# Patient Record
Sex: Male | Born: 1955
Health system: Southern US, Community
[De-identification: ages and names within clinical notes are randomized; demographics above are authoritative.]

## PROBLEM LIST (undated history)

## (undated) DIAGNOSIS — G43109 Migraine with aura, not intractable, without status migrainosus: Secondary | ICD-10-CM

## (undated) HISTORY — DX: Migraine with aura, not intractable, without status migrainosus: G43.109

---

## 2002-04-19 ENCOUNTER — Ambulatory Visit (HOSPITAL_COMMUNITY): Admission: RE | Admit: 2002-04-19 | Discharge: 2002-04-19 | Payer: Self-pay | Admitting: Gastroenterology

## 2014-02-23 ENCOUNTER — Ambulatory Visit (INDEPENDENT_AMBULATORY_CARE_PROVIDER_SITE_OTHER): Payer: BC Managed Care – PPO | Admitting: Internal Medicine

## 2014-02-23 DIAGNOSIS — Z23 Encounter for immunization: Secondary | ICD-10-CM

## 2014-02-23 DIAGNOSIS — A09 Infectious gastroenteritis and colitis, unspecified: Secondary | ICD-10-CM

## 2014-02-23 DIAGNOSIS — Z789 Other specified health status: Secondary | ICD-10-CM

## 2014-02-23 MED ORDER — ATOVAQUONE-PROGUANIL HCL 250-100 MG PO TABS: 1.0000 | ORAL_TABLET | Freq: Every day | ORAL | Status: DC

## 2014-02-23 MED ORDER — AZITHROMYCIN 500 MG PO TABS: 500.0000 mg | ORAL_TABLET | Freq: Every day | ORAL | Status: DC

## 2014-02-23 NOTE — Progress Notes (Signed)
  Cc = travel counseling and vaccines to go to 11 day trip to [place] Subjective:    Patient ID: Brent White, male    DOB: 1956-10-17, 58 y.o.   MRN: [id number]  HPI Brent White is a 58yo M in good state of health who is here with his wife, they will be going to [place], [place] for a period of 11 days leaving may 7 thru the 18th. He previously traveled to [place] where he had typhoid oral vaccine still valid thru may. He does not have allergies to meds. Only takes asa and allergy medications.  Previous travel = [place], [place], [place], [place] imms = hep a, hep b, tetanus, flu in the last year   Review of Systems     Objective:   Physical Exam        Assessment & Plan:  Pre travel vaccine = will give yellow fever, Pre travel counseling = gave recs to prevent travelers diarrhea. And gave rx for azithromycin.   Malaria proph = gave rx for malarone and instructions to take it in addition to mosquito repellant

## 2016-01-18 ENCOUNTER — Ambulatory Visit (INDEPENDENT_AMBULATORY_CARE_PROVIDER_SITE_OTHER): Payer: BLUE CROSS/BLUE SHIELD | Admitting: Family Medicine

## 2016-01-18 VITALS — BP 110/62 | HR 94 | Temp 98.7°F | Resp 16 | Ht 71.0 in | Wt 245.0 lb

## 2016-01-18 DIAGNOSIS — Z23 Encounter for immunization: Secondary | ICD-10-CM | POA: Diagnosis not present

## 2016-01-18 DIAGNOSIS — S61412A Laceration without foreign body of left hand, initial encounter: Secondary | ICD-10-CM

## 2016-01-18 NOTE — Patient Instructions (Addendum)
You have received a TDaP vaccination today  Wound care as directed by the PA Sutured Wound Care Sutures are stitches that can be used to close wounds. Taking care of your wound properly can help to prevent pain and infection. It can also help your wound to heal more quickly. HOW TO CARE FOR YOUR SUTURED WOUND Wound Care  Keep the wound clean and dry.  If you were given a bandage (dressing), you should change it at least once per day or as directed by your health care provider. You should also change it if it becomes wet or dirty.  Keep the wound completely dry for the first 24 hours or as directed by your health care provider. After that time, you may shower or bathe. However, make sure that the wound is not soaked in water until the sutures have been removed.  Clean the wound one time each day or as directed by your health care provider.  Wash the wound with soap and water.  Rinse the wound with water to remove all soap.  Pat the wound dry with a clean towel. Do not rub the wound.  Aftercleaning the wound, apply a thin layer of antibioticointment as directed by your health care provider. This will help to prevent infection and keep the dressing from sticking to the wound.  Have the sutures removed as directed by your health care provider. General Instructions  Take or apply medicines only as directed by your health care provider.  To help prevent scarring, make sure to cover your wound with sunscreen whenever you are outside after the sutures are removed and the wound is healed. Make sure to wear a sunscreen of at least 30 SPF.  If you were prescribed an antibiotic medicine or ointment, finish all of it even if you start to feel better.  Do not scratch or pick at the wound.  Keep all follow-up visits as directed by your health care provider. This is important.  Check your wound every day for signs of infection. Watch for:   Redness, swelling, or pain.  Fluid, blood, or  pus.  Raise (elevate) the injured area above the level of your heart while you are sitting or lying down, if possible.  Avoid stretching your wound.  Drink enough fluids to keep your urine clear or pale yellow. SEEK MEDICAL CARE IF:  You received a tetanus shot and you have swelling, severe pain, redness, or bleeding at the injection site.  You have a fever.  A wound that was closed breaks open.  You notice a bad smell coming from the wound.  You notice something coming out of the wound, such as wood or glass.  Your pain is not controlled with medicine.  You have increased redness, swelling, or pain at the site of your wound.  You have fluid, blood, or pus coming from your wound.  You notice a change in the color of your skin near your wound.  You need to change the dressing frequently due to fluid, blood, or pus draining from the wound.  You develop a new rash.  You develop numbness around the wound. SEEK IMMEDIATE MEDICAL CARE IF:  You develop severe swelling around the injury site.  Your pain suddenly increases and is severe.  You develop painful lumps near the wound or on skin that is anywhere on your body.  You have a red streak going away from your wound.  The wound is on your hand or foot and you cannot properly  move a finger or toe.  The wound is on your hand or foot and you notice that your fingers or toes look pale or bluish.   This information is not intended to replace advice given to you by your health care provider. Make sure you discuss any questions you have with your health care provider.   Document Released: 12/24/2004 Document Revised: 04/02/2015 Document Reviewed: 06/28/2013 Elsevier Interactive Patient Education [employer].

## 2016-01-18 NOTE — Progress Notes (Signed)
Procedure: verbal consent obtained.  1% lidocaine placed at the wound site.  Cleansed with soap and water.  5-0 ethilon utilized to place 2 simple and 1 horizontal mattress suture at the 2cm laceration interdigit area of the 2nd and 3rd finger.  Cleansed with normal saline.  Dressings applied.

## 2016-01-18 NOTE — Progress Notes (Signed)
Patient ID: Brent White, male    DOB: [birth year]  Age: 60 y.o. MRN: [id number]  Chief Complaint  Patient presents with  . 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 He was fixing a door lock and slipped with the chisel, injuring his left hand between the webspace of the second and third finger. His last tetanus shot was 5 or 6 years ago. He is otherwise healthy except admits that he is overweight.  Current allergies, medications, problem list, past/family and social histories reviewed.  Objective:  BP 110/62 mmHg  Pulse 94  Temp(Src) 98.7 F (37.1 C) (Oral)  Resp 16  Ht 5\' 11"  (1.803 m)  Wt 245 lb (111.131 kg)  BMI 34.19 kg/m2  SpO2 96%  No major acute distress. 0.5 cm laceration in the left hand second third finger webspace palmar side. It is fairly deep cavity. Neurovascular intact.  Assessment & Plan:   Assessment: 1. Laceration of hand, left, initial encounter       Plan: Suture repair will be needed. He will receive a Tdap vaccine.  Orders Placed This Encounter  Procedures  . Tdap vaccine greater than or equal to 7yo IM    Meds ordered this encounter  Medications  . aspirin 81 MG tablet    Sig: Take 81 mg by mouth daily.         Patient Instructions  You have received a TDaP vaccination today  Wound care as directed by the PA Sutured Wound Care Sutures are stitches that can be used to close wounds. Taking care of your wound properly can help to prevent pain and infection. It can also help your wound to heal more quickly. HOW TO CARE FOR YOUR SUTURED WOUND Wound Care  Keep the wound clean and dry.  If you were given a bandage (dressing), you should change it at least once per day or as directed by your health care provider. You should also change it if it becomes wet or dirty.  Keep the wound completely dry for the first 24 hours or as directed by  your health care provider. After that time, you may shower or bathe. However, make sure that the wound is not soaked in water until the sutures have been removed.  Clean the wound one time each day or as directed by your health care provider.  Wash the wound with soap and water.  Rinse the wound with water to remove all soap.  Pat the wound dry with a clean towel. Do not rub the wound.  Aftercleaning the wound, apply a thin layer of antibioticointment as directed by your health care provider. This will help to prevent infection and keep the dressing from sticking to the wound.  Have the sutures removed as directed by your health care provider. General Instructions  Take or apply medicines only as directed by your health care provider.  To help prevent scarring, make sure to cover your wound with sunscreen whenever you are outside after the sutures are removed and the wound is healed. Make sure to wear a sunscreen of at least 30 SPF.  If you were prescribed an antibiotic medicine or ointment, finish all of it even if you start to feel better.  Do not scratch or pick at the wound.  Keep all follow-up visits as directed by your health care provider. This is important.  Check your  wound every day for signs of infection. Watch for:   Redness, swelling, or pain.  Fluid, blood, or pus.  Raise (elevate) the injured area above the level of your heart while you are sitting or lying down, if possible.  Avoid stretching your wound.  Drink enough fluids to keep your urine clear or pale yellow. SEEK MEDICAL CARE IF:  You received a tetanus shot and you have swelling, severe pain, redness, or bleeding at the injection site.  You have a fever.  A wound that was closed breaks open.  You notice a bad smell coming from the wound.  You notice something coming out of the wound, such as wood or glass.  Your pain is not controlled with medicine.  You have increased redness, swelling, or  pain at the site of your wound.  You have fluid, blood, or pus coming from your wound.  You notice a change in the color of your skin near your wound.  You need to change the dressing frequently due to fluid, blood, or pus draining from the wound.  You develop a new rash.  You develop numbness around the wound. SEEK IMMEDIATE MEDICAL CARE IF:  You develop severe swelling around the injury site.  Your pain suddenly increases and is severe.  You develop painful lumps near the wound or on skin that is anywhere on your body.  You have a red streak going away from your wound.  The wound is on your hand or foot and you cannot properly move a finger or toe.  The wound is on your hand or foot and you notice that your fingers or toes look pale or bluish.   This information is not intended to replace advice given to you by your health care provider. Make sure you discuss any questions you have with your health care provider.   Document Released: 12/24/2004 Document Revised: 04/02/2015 Document Reviewed: 06/28/2013 Elsevier Interactive Patient Education [employer].      Return in about 10 days (around 01/28/2016).   HOPPER,DAVID, MD 01/18/2016

## 2016-01-28 ENCOUNTER — Other Ambulatory Visit: Payer: Self-pay | Admitting: Cardiology

## 2016-01-28 DIAGNOSIS — R0789 Other chest pain: Secondary | ICD-10-CM

## 2016-01-29 ENCOUNTER — Ambulatory Visit
Admission: RE | Admit: 2016-01-29 | Discharge: 2016-01-29 | Disposition: A | Discharge status: Home / Self Care | Payer: No Typology Code available for payment source | Source: Ambulatory Visit | Attending: Cardiology | Admitting: Cardiology

## 2016-01-29 DIAGNOSIS — R0789 Other chest pain: Secondary | ICD-10-CM

## 2016-02-13 ENCOUNTER — Encounter: Payer: Self-pay | Admitting: Podiatry

## 2016-02-13 ENCOUNTER — Ambulatory Visit (INDEPENDENT_AMBULATORY_CARE_PROVIDER_SITE_OTHER): Payer: BLUE CROSS/BLUE SHIELD | Admitting: Podiatry

## 2016-02-13 ENCOUNTER — Ambulatory Visit (INDEPENDENT_AMBULATORY_CARE_PROVIDER_SITE_OTHER): Payer: BLUE CROSS/BLUE SHIELD

## 2016-02-13 VITALS — BP 107/63 | HR 65 | Resp 16 | Ht 71.0 in | Wt 240.0 lb

## 2016-02-13 DIAGNOSIS — M779 Enthesopathy, unspecified: Secondary | ICD-10-CM

## 2016-02-13 MED ORDER — MELOXICAM 15 MG PO TABS: 15.0000 mg | ORAL_TABLET | Freq: Every day | ORAL | Status: DC

## 2016-02-13 NOTE — Progress Notes (Signed)
   Subjective:    Patient ID: Brent White, male    DOB: 06-16-1956, 60 y.o.   MRN: [id number]  HPI Patient presents with foot pain in their left foot; dorsal-below 1st toe & medial side. Pt stated, "pain is on and off and got foot stuck underneath a chair"; x1 month   Review of Systems  All other systems reviewed and are negative.      Objective:   Physical Exam        Assessment & Plan:

## 2016-02-14 NOTE — Progress Notes (Signed)
Subjective:     Patient ID: Brent White, male   DOB: [birth year], 60 y.o.   MRN: [id number]  HPI patient states he has pain on the top and side of his left foot secondary to an injury that was sustained 1 month ago with discomfort with palpation and inability to walk without some degree of compensation. He states the pain is not severe but he was concerned about fracture   Review of Systems  All other systems reviewed and are negative.      Objective:   Physical Exam  Constitutional: He is oriented to person, place, and time.  Cardiovascular: Intact distal pulses.   Musculoskeletal: Normal range of motion.  Neurological: He is oriented to person, place, and time.  Skin: Skin is warm.  Nursing note and vitals reviewed.  neurovascular status found to be intact muscle strength adequate range of motion within normal limits. Patient's noted to have discomfort on the dorsal medial aspect of left foot with mild edema but it's localized in nature with no significant pathology. Patient is noted to have no muscle strength loss of the extensors or flexors also besides the midfoot and range of motion first MPJ was adequate     Assessment:     Probable low grade trauma to the left foot with inflammation    Plan:     H&P and x-rays reviewed of condition. At this point I have recommended oral anti-inflammatories which were prescribed today mobile big 15 mg daily along with ice therapy and reduced activity. If symptoms continue patient will let us know

## 2016-10-12 DIAGNOSIS — R42 Dizziness and giddiness: Secondary | ICD-10-CM | POA: Diagnosis not present

## 2016-10-12 DIAGNOSIS — R001 Bradycardia, unspecified: Secondary | ICD-10-CM | POA: Diagnosis not present

## 2017-01-22 DIAGNOSIS — E7800 Pure hypercholesterolemia, unspecified: Secondary | ICD-10-CM | POA: Diagnosis not present

## 2017-01-22 DIAGNOSIS — Z125 Encounter for screening for malignant neoplasm of prostate: Secondary | ICD-10-CM | POA: Diagnosis not present

## 2017-01-22 DIAGNOSIS — Z0000 Encounter for general adult medical examination without abnormal findings: Secondary | ICD-10-CM | POA: Diagnosis not present

## 2017-04-13 DIAGNOSIS — D2371 Other benign neoplasm of skin of right lower limb, including hip: Secondary | ICD-10-CM | POA: Diagnosis not present

## 2017-04-13 DIAGNOSIS — D2262 Melanocytic nevi of left upper limb, including shoulder: Secondary | ICD-10-CM | POA: Diagnosis not present

## 2017-04-13 DIAGNOSIS — L821 Other seborrheic keratosis: Secondary | ICD-10-CM | POA: Diagnosis not present

## 2017-04-13 DIAGNOSIS — L918 Other hypertrophic disorders of the skin: Secondary | ICD-10-CM | POA: Diagnosis not present

## 2018-05-31 DIAGNOSIS — J209 Acute bronchitis, unspecified: Secondary | ICD-10-CM | POA: Diagnosis not present

## 2018-08-23 DIAGNOSIS — Z125 Encounter for screening for malignant neoplasm of prostate: Secondary | ICD-10-CM | POA: Diagnosis not present

## 2018-08-23 DIAGNOSIS — Z0000 Encounter for general adult medical examination without abnormal findings: Secondary | ICD-10-CM | POA: Diagnosis not present

## 2018-08-23 DIAGNOSIS — R252 Cramp and spasm: Secondary | ICD-10-CM | POA: Diagnosis not present

## 2018-08-23 DIAGNOSIS — R0683 Snoring: Secondary | ICD-10-CM | POA: Diagnosis not present

## 2018-08-23 DIAGNOSIS — E7800 Pure hypercholesterolemia, unspecified: Secondary | ICD-10-CM | POA: Diagnosis not present

## 2018-11-21 DIAGNOSIS — D3131 Benign neoplasm of right choroid: Secondary | ICD-10-CM | POA: Diagnosis not present

## 2018-12-19 DIAGNOSIS — G4719 Other hypersomnia: Secondary | ICD-10-CM | POA: Diagnosis not present

## 2019-01-12 DIAGNOSIS — G4733 Obstructive sleep apnea (adult) (pediatric): Secondary | ICD-10-CM | POA: Diagnosis not present

## 2019-06-20 DIAGNOSIS — L918 Other hypertrophic disorders of the skin: Secondary | ICD-10-CM | POA: Diagnosis not present

## 2019-06-20 DIAGNOSIS — L821 Other seborrheic keratosis: Secondary | ICD-10-CM | POA: Diagnosis not present

## 2019-09-21 DIAGNOSIS — Z0000 Encounter for general adult medical examination without abnormal findings: Secondary | ICD-10-CM | POA: Diagnosis not present

## 2019-10-02 ENCOUNTER — Other Ambulatory Visit: Payer: Self-pay

## 2019-10-02 DIAGNOSIS — Z20822 Contact with and (suspected) exposure to covid-19: Secondary | ICD-10-CM

## 2019-10-03 LAB — NOVEL CORONAVIRUS, NAA: SARS-CoV-2, NAA: NOT DETECTED

## 2019-10-16 ENCOUNTER — Other Ambulatory Visit: Payer: Self-pay

## 2019-10-16 DIAGNOSIS — Z20822 Contact with and (suspected) exposure to covid-19: Secondary | ICD-10-CM

## 2019-10-18 LAB — NOVEL CORONAVIRUS, NAA: SARS-CoV-2, NAA: NOT DETECTED

## 2019-10-20 DIAGNOSIS — Z23 Encounter for immunization: Secondary | ICD-10-CM | POA: Diagnosis not present

## 2019-10-20 DIAGNOSIS — E7800 Pure hypercholesterolemia, unspecified: Secondary | ICD-10-CM | POA: Diagnosis not present

## 2019-10-20 DIAGNOSIS — Z125 Encounter for screening for malignant neoplasm of prostate: Secondary | ICD-10-CM | POA: Diagnosis not present

## 2020-02-02 ENCOUNTER — Ambulatory Visit: Payer: No Typology Code available for payment source | Attending: Internal Medicine

## 2020-02-02 DIAGNOSIS — Z23 Encounter for immunization: Secondary | ICD-10-CM

## 2020-02-02 NOTE — Progress Notes (Signed)
   Covid-19 Vaccination Clinic  Name:  DILRAJ HERIN    MRN: [id number] DOB: 06-Sep-1956  02/02/2020  Mr. Motter was observed post Covid-19 immunization for 15 minutes without incident. He was provided with Vaccine Information Sheet and instruction to access the V-Safe system.   Mr. Gaw was instructed to call 911 with any severe reactions post vaccine: Marland Kitchen Difficulty breathing  . Swelling of face and throat  . A fast heartbeat  . A bad rash all over body  . Dizziness and weakness   Immunizations Administered    Name Date Dose VIS Date Route   Pfizer COVID-19 Vaccine 02/02/2020  5:00 PM 0.3 mL 11/10/2019 Intramuscular   Manufacturer: [place]   Lot: [id number]   [place]: [id number]

## 2020-03-05 ENCOUNTER — Ambulatory Visit: Payer: No Typology Code available for payment source | Attending: Internal Medicine

## 2020-03-05 DIAGNOSIS — Z23 Encounter for immunization: Secondary | ICD-10-CM

## 2020-03-05 NOTE — Progress Notes (Signed)
   Covid-19 Vaccination Clinic  Name:  Brent White    MRN: [id number] DOB: 01/09/1956  03/05/2020  [name] was observed post Covid-19 immunization for 15 minutes without incident. He was provided with Vaccine Information Sheet and instruction to access the V-Safe system.   [name] was instructed to call 911 with any severe reactions post vaccine: Marland Kitchen Difficulty breathing  . Swelling of face and throat  . A fast heartbeat  . A bad rash all over body  . Dizziness and weakness   Immunizations Administered    Name Date Dose VIS Date Route   Pfizer COVID-19 Vaccine 03/05/2020  4:55 PM 0.3 mL 11/10/2019 Intramuscular   Manufacturer: [employer]   Lot: [id number]   [place]: [id number]

## 2020-03-08 DIAGNOSIS — Z1159 Encounter for screening for other viral diseases: Secondary | ICD-10-CM | POA: Diagnosis not present

## 2020-03-13 DIAGNOSIS — Z800 Family history of malignant neoplasm of digestive organs: Secondary | ICD-10-CM | POA: Diagnosis not present

## 2020-03-13 DIAGNOSIS — Z1211 Encounter for screening for malignant neoplasm of colon: Secondary | ICD-10-CM | POA: Diagnosis not present

## 2020-03-13 DIAGNOSIS — K5140 Inflammatory polyps of colon without complications: Secondary | ICD-10-CM | POA: Diagnosis not present

## 2020-06-04 DIAGNOSIS — D3131 Benign neoplasm of right choroid: Secondary | ICD-10-CM | POA: Diagnosis not present

## 2020-06-11 DIAGNOSIS — I493 Ventricular premature depolarization: Secondary | ICD-10-CM | POA: Diagnosis not present

## 2020-06-23 DIAGNOSIS — S29012A Strain of muscle and tendon of back wall of thorax, initial encounter: Secondary | ICD-10-CM | POA: Diagnosis not present

## 2020-07-09 DIAGNOSIS — M546 Pain in thoracic spine: Secondary | ICD-10-CM | POA: Diagnosis not present

## 2020-07-09 DIAGNOSIS — M6281 Muscle weakness (generalized): Secondary | ICD-10-CM | POA: Diagnosis not present

## 2020-07-11 DIAGNOSIS — M546 Pain in thoracic spine: Secondary | ICD-10-CM | POA: Diagnosis not present

## 2020-07-11 DIAGNOSIS — M6281 Muscle weakness (generalized): Secondary | ICD-10-CM | POA: Diagnosis not present

## 2020-07-15 DIAGNOSIS — M546 Pain in thoracic spine: Secondary | ICD-10-CM | POA: Diagnosis not present

## 2020-07-15 DIAGNOSIS — M6281 Muscle weakness (generalized): Secondary | ICD-10-CM | POA: Diagnosis not present

## 2020-07-18 DIAGNOSIS — M6281 Muscle weakness (generalized): Secondary | ICD-10-CM | POA: Diagnosis not present

## 2020-07-18 DIAGNOSIS — M546 Pain in thoracic spine: Secondary | ICD-10-CM | POA: Diagnosis not present

## 2020-07-22 DIAGNOSIS — M546 Pain in thoracic spine: Secondary | ICD-10-CM | POA: Diagnosis not present

## 2020-07-22 DIAGNOSIS — M6281 Muscle weakness (generalized): Secondary | ICD-10-CM | POA: Diagnosis not present

## 2020-07-25 DIAGNOSIS — M6281 Muscle weakness (generalized): Secondary | ICD-10-CM | POA: Diagnosis not present

## 2020-07-25 DIAGNOSIS — M546 Pain in thoracic spine: Secondary | ICD-10-CM | POA: Diagnosis not present

## 2020-07-29 ENCOUNTER — Encounter: Payer: Self-pay | Admitting: Cardiology

## 2020-07-29 ENCOUNTER — Ambulatory Visit: Payer: BC Managed Care – PPO | Admitting: Cardiology

## 2020-07-29 ENCOUNTER — Other Ambulatory Visit: Payer: Self-pay

## 2020-07-29 VITALS — BP 108/70 | HR 62 | Ht 71.0 in | Wt 237.0 lb

## 2020-07-29 DIAGNOSIS — I493 Ventricular premature depolarization: Secondary | ICD-10-CM

## 2020-07-29 NOTE — Patient Instructions (Signed)
Medication Instructions:  The current medical regimen is effective;  continue present plan and medications.  *If you need a refill on your cardiac medications before your next appointment, please call your pharmacy*  Testing/Procedures: Your physician has requested that you have an echocardiogram. Echocardiography is a painless test that uses sound waves to create images of your heart. It provides your doctor with information about the size and shape of your heart and how well your heart's chambers and valves are working. This procedure takes approximately one hour. There are no restrictions for this procedure.  Follow-Up: At [facility], you and your health needs are our priority.  As part of our continuing mission to provide you with exceptional heart care, we have created designated Provider Care Teams.  These Care Teams include your primary Cardiologist (physician) and Advanced Practice Providers (APPs -  Physician Assistants and Nurse Practitioners) who all work together to provide you with the care you need, when you need it.  We recommend signing up for the patient portal called "MyChart".  Sign up information is provided on this After Visit Summary.  MyChart is used to connect with patients for Virtual Visits (Telemedicine).  Patients are able to view lab/test results, encounter notes, upcoming appointments, etc.  Non-urgent messages can be sent to your provider as well.   To learn more about what you can do with MyChart, go to [email or web address].    Your next appointment:   12 month(s)  The format for your next appointment:   In Person  Provider:   Mark Skains, MD   Thank you for choosing [facility]!!      

## 2020-07-29 NOTE — Progress Notes (Signed)
Cardiology Office Note:    Date:  07/29/2020   ID:  Aldean, Pipe 06-30-1956, MRN [id number]  PCP:  Antony Contras, MD  [facility] Cardiologist:  Candee Furbish, MD  [facility] Electrophysiologist:  None   Referring MD: Antony Contras, MD     History of Present Illness:    Brent White is a 64 y.o. male here for the evaluation of PVCs at the request of Dr. Antony Contras.  In review of Dr. Harvie Bridge note from 06/11/2020, he has had a prior history of PVCs.   Back in June felt more common. He feels a bump. Was every 4-10 beats. Still there every 30-40 beats. Doing ok.  Not having any dyspnea with exertion.  He was able to climb lighthouse in the [employer].  Saw Dr. Wynonia Lawman back in 2017.  Had a exercise treadmill test which was normal.  He also had a coronary calcium score IMPRESSION: 1. Mild calcified plaque in the LAD. 2. CORONARY CALCIUM Total Agatston Score: 5.4 ; MESA database percentile: 36 th %   Been on the low-dose beta-blocker but did not able to tolerate.  Overall no chest pain no fevers no chills no nausea no vomiting.  His father had a pacemaker defibrillator atrial fibrillation aortic valve dysfunction.  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   Current Medications: Current Meds  Medication Sig   aspirin 81 MG tablet Take 81 mg by mouth daily.   meloxicam (MOBIC) 15 MG tablet Take 1 tablet (15 mg total) by mouth daily.     Allergies:   Patient has no known allergies.   Social History   Socioeconomic History   Marital status: Married    Spouse name: Not on file   Number of children: Not on file   Years of education: Not on file   Highest education level: Not on file  Occupational History   Not on file  Tobacco Use   Smoking status: Never Smoker  Substance and Sexual Activity   Alcohol use: No    Alcohol/week: 0.0 standard drinks   Drug use: No    Sexual activity: Not on file  Other Topics Concern   Not on file  Social History Narrative   Not on file   Social Determinants of Health   Financial Resource Strain:    Difficulty of Paying Living Expenses: Not on file  Food Insecurity:    Worried About [place] in the Last Year: Not on file   Ran Out of Food in the Last Year: Not on file  Transportation Needs:    Lack of Transportation (Medical): Not on file   Lack of Transportation (Non-Medical): Not on file  Physical Activity:    Days of Exercise per Week: Not on file   Minutes of Exercise per Session: Not on file  Stress:    Feeling of Stress : Not on file  Social Connections:    Frequency of Communication with Friends and Family: Not on file   Frequency of Social Gatherings with Friends and Family: Not on file   Attends Religious Services: Not on file   Active Member of Clubs or Organizations: Not on file   Attends Archivist Meetings: Not on file   Marital Status: Not on file     Family History: Father with heart failure, ICD  ROS:   Please see the history of present illness.  All other systems reviewed and are negative.  EKGs/Labs/Other Studies Reviewed:    The following studies were reviewed today: As above  EKG:  EKG is  ordered today.  The ekg ordered today demonstrates sinus rhythm 62 with no other abnormalities  Recent Labs: No results found for requested labs within last 8760 hours.  Recent Lipid Panel No results found for: CHOL, TRIG, HDL, CHOLHDL, VLDL, LDLCALC, LDLDIRECT  Physical Exam:    VS:  BP 108/70    Pulse 62    Ht 5\' 11"  (1.803 m)    Wt 237 lb (107.5 kg)    SpO2 95%    BMI 33.05 kg/m     Wt Readings from Last 3 Encounters:  07/29/20 237 lb (107.5 kg)  02/13/16 240 lb (108.9 kg)  01/18/16 245 lb (111.1 kg)     GEN:  Well nourished, well developed in no acute distress HEENT: Normal NECK: No JVD; No carotid bruits LYMPHATICS: No  lymphadenopathy CARDIAC: RRR, no murmurs, rubs, gallops, rare ectopy RESPIRATORY:  Clear to auscultation without rales, wheezing or rhonchi  ABDOMEN: Soft, non-tender, non-distended MUSCULOSKELETAL:  No edema; No deformity  SKIN: Warm and dry NEUROLOGIC:  Alert and oriented x 3 PSYCHIATRIC:  Normal affect   ASSESSMENT:    1. PVC's (premature ventricular contractions)    PLAN:    In order of problems listed above:  PVCs -On auscultation today, after about 15 seconds I did hear an isolated PVC.  He did not feel it.  Overall relatively asymptomatic.  Explained to him these are benign.  We will check an echocardiogram since it has been several years.  He did see me back at [facility] cardiology.  His father did have heart failure ICD. -For now, will continue conservative management, hydration, exercise, good sleep hygiene. -If PVCs were to become more problematic, occasionally we would utilize flecainide in this situation. -Thankfully this is not atrial fibrillation.  Coronary calcification -Very small amount of LAD calcification seen on prior coronary calcium score.  Continue with diet, exercise.  He does take a low-dose aspirin.  Would encourage LDL cholesterol to be in 100 at least.   Medication Adjustments/Labs and Tests Ordered: Current medicines are reviewed at length with the patient today.  Concerns regarding medicines are outlined above.  Orders Placed This Encounter  Procedures   EKG 12-Lead   ECHOCARDIOGRAM COMPLETE   No orders of the defined types were placed in this encounter.   Patient Instructions  Medication Instructions:  The current medical regimen is effective;  continue present plan and medications.  *If you need a refill on your cardiac medications before your next appointment, please call your pharmacy*  Testing/Procedures: Your physician has requested that you have an echocardiogram. Echocardiography is a painless test that uses sound waves to create images  of your heart. It provides your doctor with information about the size and shape of your heart and how well your hearts chambers and valves are working. This procedure takes approximately one hour. There are no restrictions for this procedure.  Follow-Up: At [facility], you and your health needs are our priority.  As part of our continuing mission to provide you with exceptional heart care, we have created designated Provider Care Teams.  These Care Teams include your primary Cardiologist (physician) and Advanced Practice Providers (APPs -  Physician Assistants and Nurse Practitioners) who all work together to provide you with the care you need, when you need it.  We recommend signing up for the patient  portal called "MyChart".  Sign up information is provided on this After Visit Summary.  MyChart is used to connect with patients for Virtual Visits (Telemedicine).  Patients are able to view lab/test results, encounter notes, upcoming appointments, etc.  Non-urgent messages can be sent to your provider as well.   To learn more about what you can do with MyChart, go to [email or web address].    Your next appointment:   12 month(s)  The format for your next appointment:   In Person  Provider:   Candee Furbish, MD   Thank you for choosing [facility]!!         Signed, Candee Furbish, MD  07/29/2020 5:17 PM    [place]

## 2020-08-07 ENCOUNTER — Other Ambulatory Visit: Payer: Self-pay

## 2020-08-07 ENCOUNTER — Ambulatory Visit (HOSPITAL_COMMUNITY): Payer: BC Managed Care – PPO | Attending: Cardiology

## 2020-08-07 DIAGNOSIS — I493 Ventricular premature depolarization: Secondary | ICD-10-CM | POA: Insufficient documentation

## 2020-08-07 LAB — ECHOCARDIOGRAM COMPLETE
Area-P 1/2: 2.9 cm2
S' Lateral: 3.3 cm

## 2020-09-27 DIAGNOSIS — Z1211 Encounter for screening for malignant neoplasm of colon: Secondary | ICD-10-CM | POA: Diagnosis not present

## 2020-09-27 DIAGNOSIS — Z125 Encounter for screening for malignant neoplasm of prostate: Secondary | ICD-10-CM | POA: Diagnosis not present

## 2020-09-27 DIAGNOSIS — E7800 Pure hypercholesterolemia, unspecified: Secondary | ICD-10-CM | POA: Diagnosis not present

## 2020-09-27 DIAGNOSIS — M25559 Pain in unspecified hip: Secondary | ICD-10-CM | POA: Diagnosis not present

## 2020-09-27 DIAGNOSIS — Z1389 Encounter for screening for other disorder: Secondary | ICD-10-CM | POA: Diagnosis not present

## 2020-09-27 DIAGNOSIS — Z91030 Bee allergy status: Secondary | ICD-10-CM | POA: Diagnosis not present

## 2020-09-27 DIAGNOSIS — I493 Ventricular premature depolarization: Secondary | ICD-10-CM | POA: Diagnosis not present

## 2020-09-27 DIAGNOSIS — Z23 Encounter for immunization: Secondary | ICD-10-CM | POA: Diagnosis not present

## 2020-09-27 DIAGNOSIS — Z0000 Encounter for general adult medical examination without abnormal findings: Secondary | ICD-10-CM | POA: Diagnosis not present

## 2020-09-27 DIAGNOSIS — J309 Allergic rhinitis, unspecified: Secondary | ICD-10-CM | POA: Diagnosis not present

## 2020-11-13 DIAGNOSIS — R69 Illness, unspecified: Secondary | ICD-10-CM | POA: Diagnosis not present

## 2020-12-13 DIAGNOSIS — R059 Cough, unspecified: Secondary | ICD-10-CM | POA: Diagnosis not present

## 2020-12-13 DIAGNOSIS — Z8616 Personal history of COVID-19: Secondary | ICD-10-CM | POA: Diagnosis not present

## 2020-12-26 DIAGNOSIS — R059 Cough, unspecified: Secondary | ICD-10-CM | POA: Diagnosis not present

## 2020-12-26 DIAGNOSIS — Z8616 Personal history of COVID-19: Secondary | ICD-10-CM | POA: Diagnosis not present

## 2021-06-23 DIAGNOSIS — M5432 Sciatica, left side: Secondary | ICD-10-CM | POA: Diagnosis not present

## 2021-06-24 ENCOUNTER — Other Ambulatory Visit: Payer: Self-pay | Admitting: Family Medicine

## 2021-06-24 DIAGNOSIS — M5432 Sciatica, left side: Secondary | ICD-10-CM

## 2021-06-25 ENCOUNTER — Other Ambulatory Visit: Payer: Self-pay

## 2021-06-25 ENCOUNTER — Ambulatory Visit
Admission: RE | Admit: 2021-06-25 | Discharge: 2021-06-25 | Disposition: A | Discharge status: Home / Self Care | Payer: Medicare HMO | Source: Ambulatory Visit | Attending: Family Medicine | Admitting: Family Medicine

## 2021-06-25 DIAGNOSIS — M48061 Spinal stenosis, lumbar region without neurogenic claudication: Secondary | ICD-10-CM | POA: Diagnosis not present

## 2021-06-25 DIAGNOSIS — M5432 Sciatica, left side: Secondary | ICD-10-CM

## 2021-07-07 DIAGNOSIS — Z6831 Body mass index (BMI) 31.0-31.9, adult: Secondary | ICD-10-CM | POA: Diagnosis not present

## 2021-07-07 DIAGNOSIS — M48062 Spinal stenosis, lumbar region with neurogenic claudication: Secondary | ICD-10-CM | POA: Diagnosis not present

## 2021-07-07 DIAGNOSIS — M4317 Spondylolisthesis, lumbosacral region: Secondary | ICD-10-CM | POA: Diagnosis not present

## 2021-07-21 DIAGNOSIS — S39012A Strain of muscle, fascia and tendon of lower back, initial encounter: Secondary | ICD-10-CM | POA: Diagnosis not present

## 2021-07-21 DIAGNOSIS — M6281 Muscle weakness (generalized): Secondary | ICD-10-CM | POA: Diagnosis not present

## 2021-07-23 DIAGNOSIS — M6281 Muscle weakness (generalized): Secondary | ICD-10-CM | POA: Diagnosis not present

## 2021-07-23 DIAGNOSIS — S39012A Strain of muscle, fascia and tendon of lower back, initial encounter: Secondary | ICD-10-CM | POA: Diagnosis not present

## 2021-07-25 NOTE — Progress Notes (Signed)
Cardiology Office Note:    Date:  07/30/2021   ID:  AARN BLOCKER, DOB 10/11/1956, MRN [id number]  PCP:  Antony Contras, MD  [facility] Cardiologist:  Candee Furbish, MD  [facility] Electrophysiologist:  None   Referring MD: Antony Contras, MD    History of Present Illness:    Brent White is a 65 y.o. male here for follow-up of palpitations.  In review of Dr. Laqueta Linden note from 06/11/2020, he has had a prior history of PVCs.   Back in June felt more common. He feels a bump. Was every 4-10 beats. Still there every 30-40 beats. Doing ok.  Not having any dyspnea with exertion.  He was able to climb lighthouse in the [employer].  Saw Dr. Wynonia Lawman back in 2017.  Had a exercise treadmill test which was normal.  He also had a coronary calcium score IMPRESSION: 1. Mild calcified plaque in the LAD. 2. CORONARY CALCIUM Total Agatston Score: 5.4 ; MESA database percentile: 36 th %   Been on the low-dose beta-blocker but did not able to tolerate.  Overall no chest pain no fevers no chills no nausea no vomiting.  His father had a pacemaker defibrillator atrial fibrillation aortic valve dysfunction.  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    Today, overall he is doing quite well.  Very rarely will he feel the palpitations mostly when dehydrated for instance.  He has not had any syncope bleeding chest pain fevers chills nausea vomiting.  He is on no medications currently.  He is looking forward to a ski trip to [place], [place].  We discussed the potential for Diamox, hydration.  He will be seeing his primary care physician prior to leaving.  LDL 102 hemoglobin 15.4 creatinine 1.17 potassium 4.3 magnesium 2.3 from outside labs.  TSH 1.6.   Current Medications: Current Meds  Medication Sig   aspirin 81 MG tablet Take 81 mg by mouth daily.     Allergies:   Patient has no known allergies.    Social History   Socioeconomic History   Marital status: Married    Spouse name: Not on file   Number of children: Not on file   Years of education: Not on file   Highest education level: Not on file  Occupational History   Not on file  Tobacco Use   Smoking status: Never   Smokeless tobacco: Never  Substance and Sexual Activity   Alcohol use: No    Alcohol/week: 0.0 standard drinks   Drug use: No   Sexual activity: Not on file  Other Topics Concern   Not on file  Social History Narrative   Not on file   Social Determinants of Health   Financial Resource Strain: Not on file  Food Insecurity: Not on file  Transportation Needs: Not on file  Physical Activity: Not on file  Stress: Not on file  Social Connections: Not on file     Family History: Father with heart failure, ICD  ROS:   Please see the history of present illness.    (+) All other systems reviewed and are negative.  EKGs/Labs/Other Studies Reviewed:    The following studies were reviewed today: As above  Echo 08/07/2020:  1. Left ventricular ejection fraction, by estimation, is 60 to 65%. Left  ventricular ejection fraction by 3D volume is 66 %. The left ventricle has  normal function. The left  ventricle has no regional wall motion  abnormalities. Left ventricular diastolic   parameters are consistent with Grade I diastolic dysfunction (impaired  relaxation).   2. Right ventricular systolic function is normal. The right ventricular  size is mildly enlarged. There is normal pulmonary artery systolic  pressure.   3. The mitral valve is normal in structure. Trivial mitral valve  regurgitation. No evidence of mitral stenosis.   4. The aortic valve is normal in structure. Aortic valve regurgitation is  not visualized. Mild to moderate aortic valve sclerosis/calcification is  present, without any evidence of aortic stenosis.   5. The inferior vena cava is normal in size with greater than 50%   respiratory variability, suggesting right atrial pressure of 3 mmHg.   EKG:  EKG is personally reviewed and interpreted.  07/30/2021: Sinus bradycardia 57 no other abnormalities 07/29/2020: sinus rhythm 62 with no other abnormalities  Recent Labs: No results found for requested labs within last 8760 hours.  Recent Lipid Panel No results found for: CHOL, TRIG, HDL, CHOLHDL, VLDL, LDLCALC, LDLDIRECT  Physical Exam:    VS:  BP 96/68 (BP Location: Left Arm, Patient Position: Sitting, Cuff Size: Normal)   Pulse (!) 57   Ht '5\' 11"'$  (1.803 m)   Wt 226 lb (102.5 kg)   SpO2 96%   BMI 31.52 kg/m     Wt Readings from Last 3 Encounters:  07/30/21 226 lb (102.5 kg)  07/29/20 237 lb (107.5 kg)  02/13/16 240 lb (108.9 kg)     GEN: Well nourished, well developed in no acute distress HEENT: Normal NECK: No JVD; No carotid bruits LYMPHATICS: No lymphadenopathy CARDIAC: RRR, no murmurs, rubs, gallops RESPIRATORY:  Clear to auscultation without rales, wheezing or rhonchi  ABDOMEN: Soft, non-tender, non-distended MUSCULOSKELETAL:  No edema; No deformity  SKIN: Warm and dry NEUROLOGIC:  Alert and oriented x 3 PSYCHIATRIC:  Normal affect     ASSESSMENT:    1. Coronary artery calcification   2. Premature ventricular contractions     PLAN:    In order of problems listed above: Coronary artery calcification We will go ahead and recheck a coronary calcium score since it has been 5 years.  Prior score was 5 with small LAD calcification noted.  Personally reviewed CT scan and reviewed with patient.  If there has been any significant change in calcification/atherosclerosis, we will discuss potential medication options with continued diet and exercise.  Premature ventricular contractions Rare symptoms, usually when potentially dehydrated.  Echocardiogram previously was reassuring.  Normal structure and function.  His father did have heart failure/ICD.  He is on conservative management at this  point with hydration exercise good sleep hygiene.  Doing well.  No high risk symptoms such as syncope.  Follow-up: PRN, we will follow-up with testing coronary score  Medication Adjustments/Labs and Tests Ordered: Current medicines are reviewed at length with the patient today.  Concerns regarding medicines are outlined above.  Orders Placed This Encounter  Procedures   CT CARDIAC SCORING (SELF PAY ONLY)   EKG 12-Lead    No orders of the defined types were placed in this encounter.   Patient Instructions  Medication Instructions:   Your physician recommends that you continue on your current medications as directed. Please refer to the Current Medication list given to you today.   *If you need a refill on your cardiac medications before your next appointment, please call your pharmacy*   Lab [place]    If you have labs (  blood work) drawn today and your tests are completely normal, you will receive your results only by: [place] (if you have MyChart) OR A paper copy in the mail If you have any lab test that is abnormal or we need to change your treatment, we will call you to review the results.   Testing/Procedures: Non-Cardiac CT Angiography (CTA), is a special type of CT scan that uses a computer to produce multi-dimensional views of major blood vessels throughout the body. In CT angiography, a contrast material is injected through an IV to help visualize the blood vessels     Follow-Up: At [facility], you and your health needs are our priority.  As part of our continuing mission to provide you with exceptional heart care, we have created designated Provider Care Teams.  These Care Teams include your primary Cardiologist (physician) and Advanced Practice Providers (APPs -  Physician Assistants and Nurse Practitioners) who all work together to provide you with the care you need, when you need it.  We recommend signing up for the patient portal  called "MyChart".  Sign up information is provided on this After Visit Summary.  MyChart is used to connect with patients for Virtual Visits (Telemedicine).  Patients are able to view lab/test results, encounter notes, upcoming appointments, etc.  Non-urgent messages can be sent to your provider as well.   To learn more about what you can do with MyChart, go to [email or web address].    Your next appointment:  CONTACT [facility] [phone] AS NEEDED FOR  ANY CARDIAC RELATED SYMPTOMS  The format for your next appointment:   In Person  Provider:   You may see Candee Furbish, MD or one of the following Advanced Practice Providers on your designated Care Team:   Cecilie Kicks, NP    Other Instructions   Signed, Candee Furbish, MD  07/30/2021 9:26 AM    [place]

## 2021-07-30 ENCOUNTER — Ambulatory Visit (INDEPENDENT_AMBULATORY_CARE_PROVIDER_SITE_OTHER)
Admission: RE | Admit: 2021-07-30 | Discharge: 2021-07-30 | Disposition: A | Discharge status: Home / Self Care | Payer: Self-pay | Source: Ambulatory Visit | Attending: Cardiology | Admitting: Cardiology

## 2021-07-30 ENCOUNTER — Ambulatory Visit: Payer: Medicare HMO | Admitting: Cardiology

## 2021-07-30 ENCOUNTER — Encounter: Payer: Self-pay | Admitting: Cardiology

## 2021-07-30 ENCOUNTER — Other Ambulatory Visit: Payer: Self-pay

## 2021-07-30 DIAGNOSIS — I493 Ventricular premature depolarization: Secondary | ICD-10-CM | POA: Diagnosis not present

## 2021-07-30 DIAGNOSIS — I2510 Atherosclerotic heart disease of native coronary artery without angina pectoris: Secondary | ICD-10-CM | POA: Insufficient documentation

## 2021-07-30 DIAGNOSIS — I2584 Coronary atherosclerosis due to calcified coronary lesion: Secondary | ICD-10-CM

## 2021-07-30 NOTE — Patient Instructions (Addendum)
Medication Instructions:   Your physician recommends that you continue on your current medications as directed. Please refer to the Current Medication list given to you today.   *If you need a refill on your cardiac medications before your next appointment, please call your pharmacy*   Lab [place]    If you have labs (blood work) drawn today and your tests are completely normal, you will receive your results only by: [place] (if you have MyChart) OR A paper copy in the mail If you have any lab test that is abnormal or we need to change your treatment, we will call you to review the results.   Testing/Procedures: Non-Cardiac CT Angiography (CTA), is a special type of CT scan that uses a computer to produce multi-dimensional views of major blood vessels throughout the body. In CT angiography, a contrast material is injected through an IV to help visualize the blood vessels     Follow-Up: At [facility], you and your health needs are our priority.  As part of our continuing mission to provide you with exceptional heart care, we have created designated Provider Care Teams.  These Care Teams include your primary Cardiologist (physician) and Advanced Practice Providers (APPs -  Physician Assistants and Nurse Practitioners) who all work together to provide you with the care you need, when you need it.  We recommend signing up for the patient portal called "MyChart".  Sign up information is provided on this After Visit Summary.  MyChart is used to connect with patients for Virtual Visits (Telemedicine).  Patients are able to view lab/test results, encounter notes, upcoming appointments, etc.  Non-urgent messages can be sent to your provider as well.   To learn more about what you can do with MyChart, go to [email or web address].    Your next appointment:  CONTACT [facility] [phone] AS NEEDED FOR  ANY CARDIAC RELATED SYMPTOMS  The format for your next  appointment:   In Person  Provider:   You may see Candee Furbish, MD or one of the following Advanced Practice Providers on your designated Care Team:   Cecilie Kicks, NP    Other Instructions

## 2021-07-30 NOTE — Assessment & Plan Note (Signed)
We will go ahead and recheck a coronary calcium score since it has been 5 years.  Prior score was 5 with small LAD calcification noted.  Personally reviewed CT scan and reviewed with patient.  If there has been any significant change in calcification/atherosclerosis, we will discuss potential medication options with continued diet and exercise.

## 2021-07-30 NOTE — Assessment & Plan Note (Signed)
Rare symptoms, usually when potentially dehydrated.  Echocardiogram previously was reassuring.  Normal structure and function.  His father did have heart failure/ICD.  He is on conservative management at this point with hydration exercise good sleep hygiene.  Doing well.  No high risk symptoms such as syncope.

## 2021-07-31 DIAGNOSIS — S39012A Strain of muscle, fascia and tendon of lower back, initial encounter: Secondary | ICD-10-CM | POA: Diagnosis not present

## 2021-07-31 DIAGNOSIS — M6281 Muscle weakness (generalized): Secondary | ICD-10-CM | POA: Diagnosis not present

## 2021-08-05 DIAGNOSIS — S39012A Strain of muscle, fascia and tendon of lower back, initial encounter: Secondary | ICD-10-CM | POA: Diagnosis not present

## 2021-08-05 DIAGNOSIS — M6281 Muscle weakness (generalized): Secondary | ICD-10-CM | POA: Diagnosis not present

## 2021-08-07 DIAGNOSIS — S39012A Strain of muscle, fascia and tendon of lower back, initial encounter: Secondary | ICD-10-CM | POA: Diagnosis not present

## 2021-08-07 DIAGNOSIS — M6281 Muscle weakness (generalized): Secondary | ICD-10-CM | POA: Diagnosis not present

## 2021-08-11 DIAGNOSIS — S39012A Strain of muscle, fascia and tendon of lower back, initial encounter: Secondary | ICD-10-CM | POA: Diagnosis not present

## 2021-08-11 DIAGNOSIS — M6281 Muscle weakness (generalized): Secondary | ICD-10-CM | POA: Diagnosis not present

## 2021-08-15 DIAGNOSIS — M6281 Muscle weakness (generalized): Secondary | ICD-10-CM | POA: Diagnosis not present

## 2021-08-15 DIAGNOSIS — S39012A Strain of muscle, fascia and tendon of lower back, initial encounter: Secondary | ICD-10-CM | POA: Diagnosis not present

## 2021-09-16 DIAGNOSIS — H2513 Age-related nuclear cataract, bilateral: Secondary | ICD-10-CM | POA: Diagnosis not present

## 2021-09-16 DIAGNOSIS — Z0100 Encounter for examination of eyes and vision without abnormal findings: Secondary | ICD-10-CM | POA: Diagnosis not present

## 2021-09-16 DIAGNOSIS — D3131 Benign neoplasm of right choroid: Secondary | ICD-10-CM | POA: Diagnosis not present

## 2021-10-27 DIAGNOSIS — R42 Dizziness and giddiness: Secondary | ICD-10-CM | POA: Diagnosis not present

## 2021-10-27 DIAGNOSIS — Z298 Encounter for other specified prophylactic measures: Secondary | ICD-10-CM | POA: Diagnosis not present

## 2021-10-27 DIAGNOSIS — M9390 Osteochondropathy, unspecified of unspecified site: Secondary | ICD-10-CM | POA: Diagnosis not present

## 2021-10-27 DIAGNOSIS — Z23 Encounter for immunization: Secondary | ICD-10-CM | POA: Diagnosis not present

## 2021-10-27 DIAGNOSIS — Z0000 Encounter for general adult medical examination without abnormal findings: Secondary | ICD-10-CM | POA: Diagnosis not present

## 2021-10-27 DIAGNOSIS — L918 Other hypertrophic disorders of the skin: Secondary | ICD-10-CM | POA: Diagnosis not present

## 2021-10-27 DIAGNOSIS — E7800 Pure hypercholesterolemia, unspecified: Secondary | ICD-10-CM | POA: Diagnosis not present

## 2021-10-27 DIAGNOSIS — Z8616 Personal history of COVID-19: Secondary | ICD-10-CM | POA: Diagnosis not present

## 2021-10-27 DIAGNOSIS — Z125 Encounter for screening for malignant neoplasm of prostate: Secondary | ICD-10-CM | POA: Diagnosis not present

## 2021-10-27 DIAGNOSIS — Z6828 Body mass index (BMI) 28.0-28.9, adult: Secondary | ICD-10-CM | POA: Diagnosis not present

## 2021-10-27 DIAGNOSIS — Z91030 Bee allergy status: Secondary | ICD-10-CM | POA: Diagnosis not present

## 2022-07-03 IMAGING — MR MR LUMBAR SPINE W/O CM
4 of 5 series · 18 of 48 positions shown · non-contrast
Comparison: None.

CLINICAL DATA: Left-sided sciatica

EXAM:
MRI LUMBAR SPINE WITHOUT CONTRAST
TECHNIQUE: Multiplanar, multisequence MR imaging of the lumbar spine was
performed. No intravenous contrast was administered.

[Series 5: T2 · sagittal · 4.0mm · 0.68mm/px · 6 of 17 slices shown (1 of 2)]
[im 1/17]
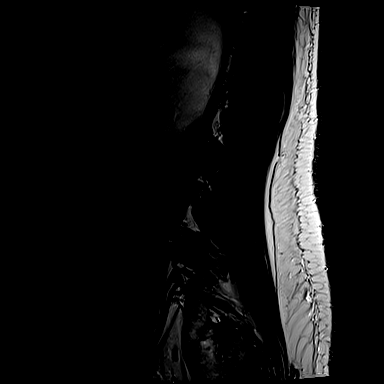
[im 4/17]
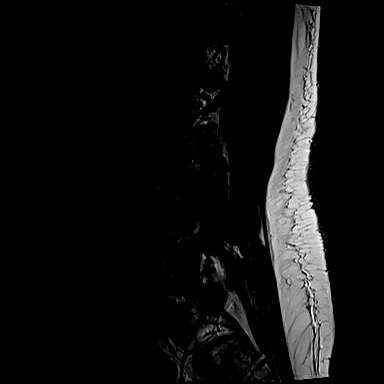
[im 7/17]
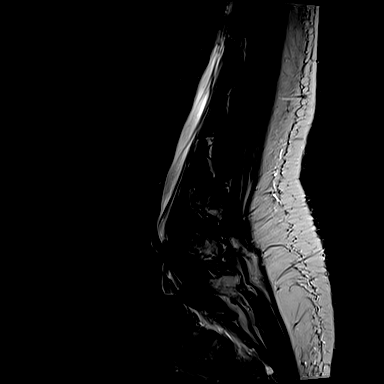
[im 10/17]
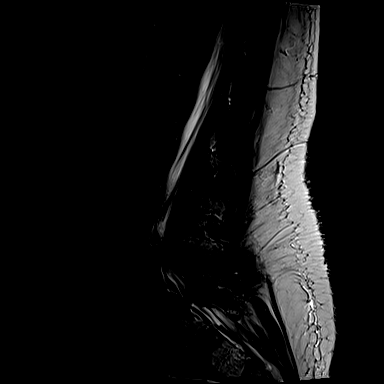
[im 13/17]
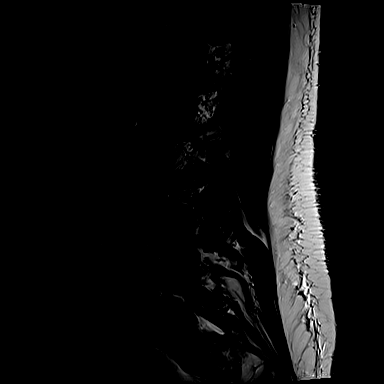
[im 17/17]
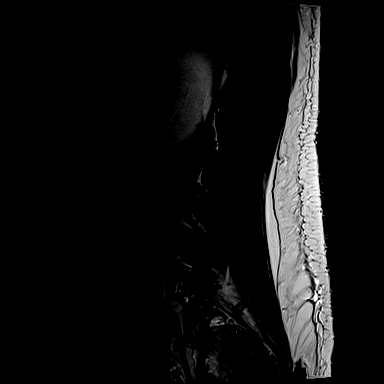

[Series 6: T1 · sagittal · 4.0mm · 0.68mm/px · 3 of 17 slices shown (1 of 2)]
[im 4/17]
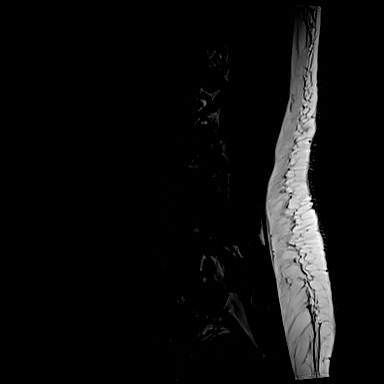
[im 10/17]
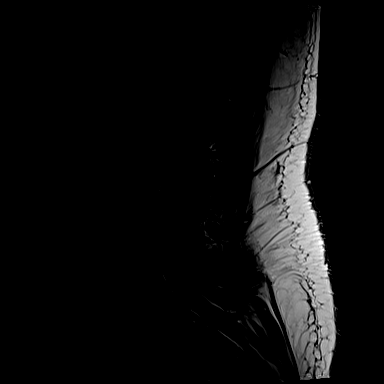
[im 17/17]
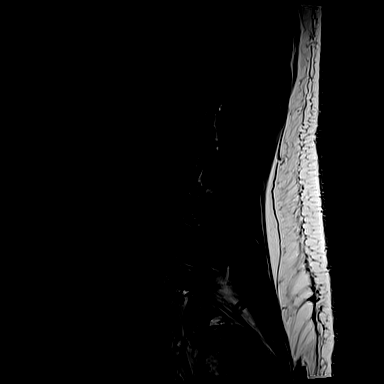

[Series 10: T1 · axial · 4.0mm · 0.28mm/px · z∈[-49,+132]mm · 3 of 43 slices shown (2 of 2)]
[im 7/43]
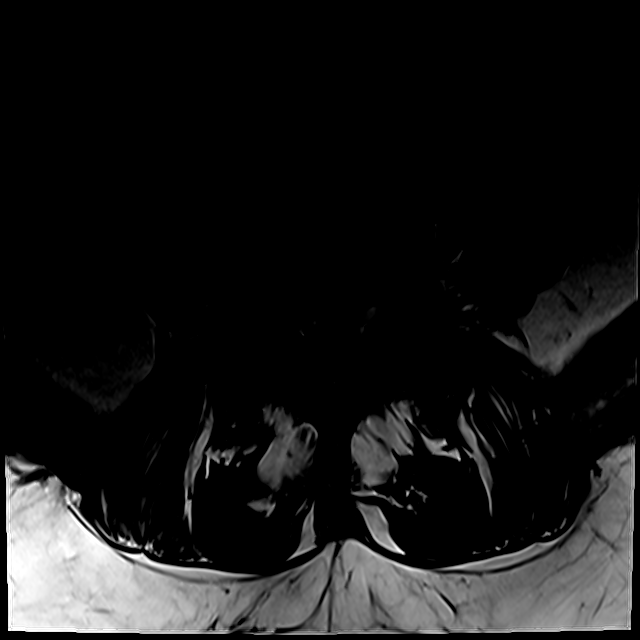
[im 22/43]
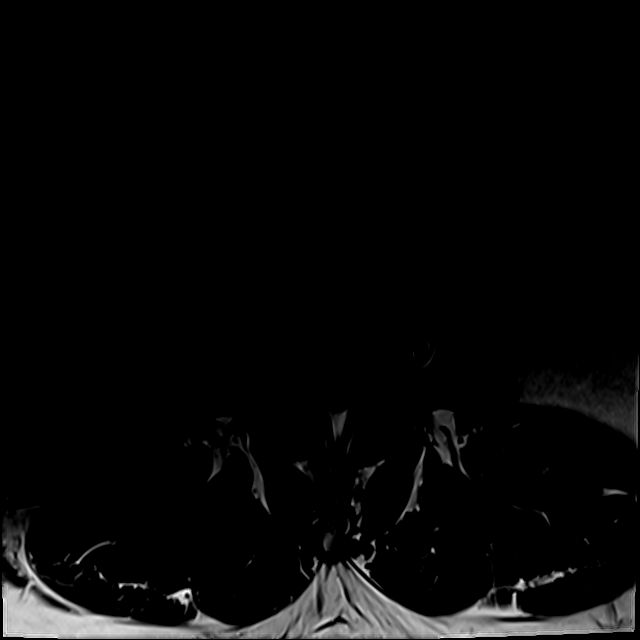
[im 37/43]
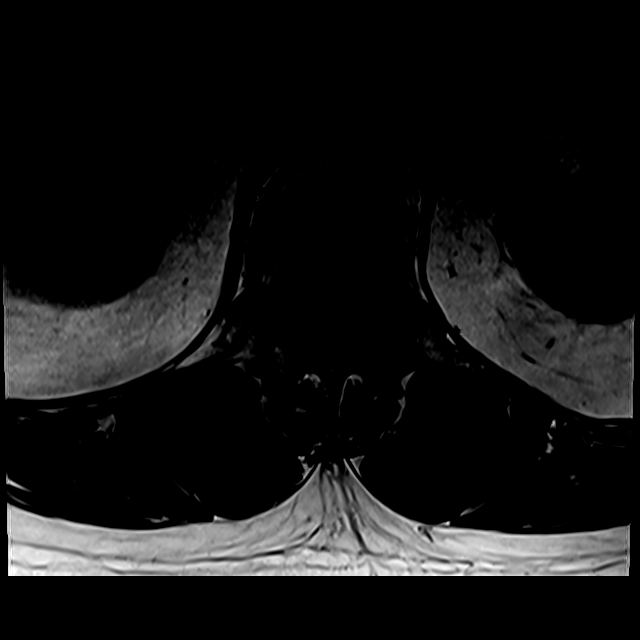

[Series 13: T2 · axial · 4.0mm · 0.28mm/px · z∈[-78,+132]mm · 6 of 43 slices shown (2 of 2)]
[im 1/43]
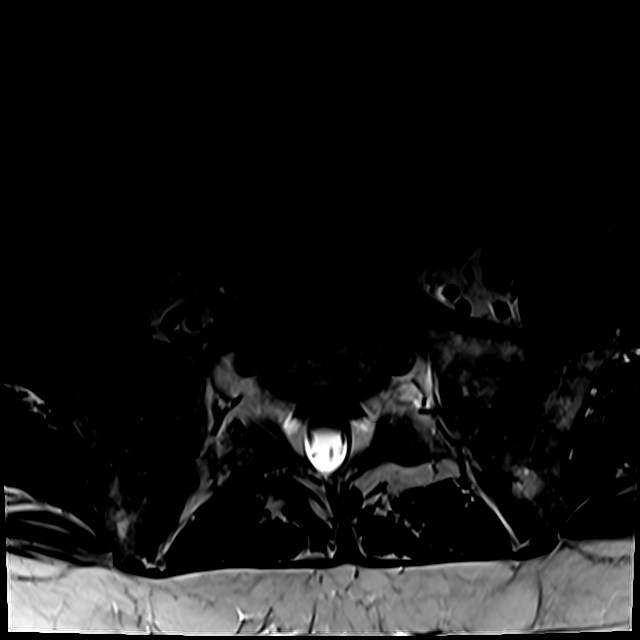
[im 7/43]
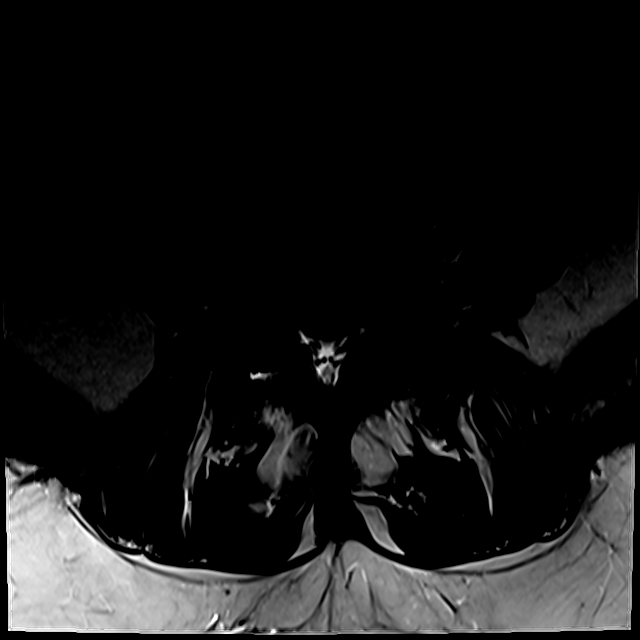
[im 13/43]
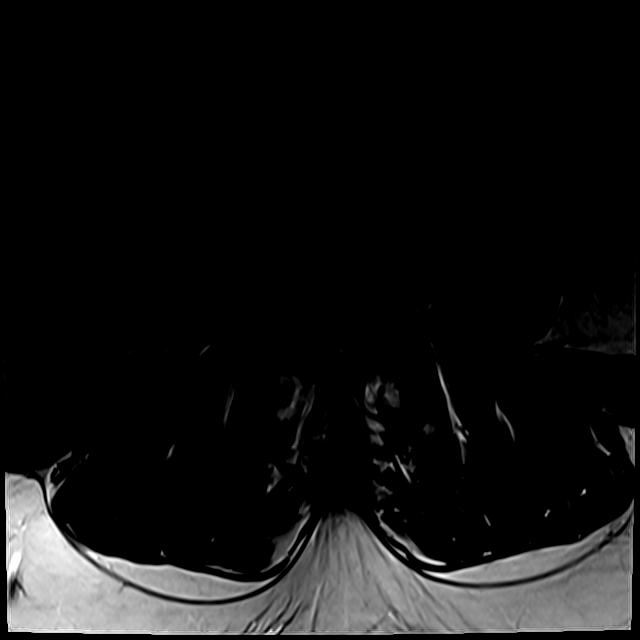
[im 19/43]
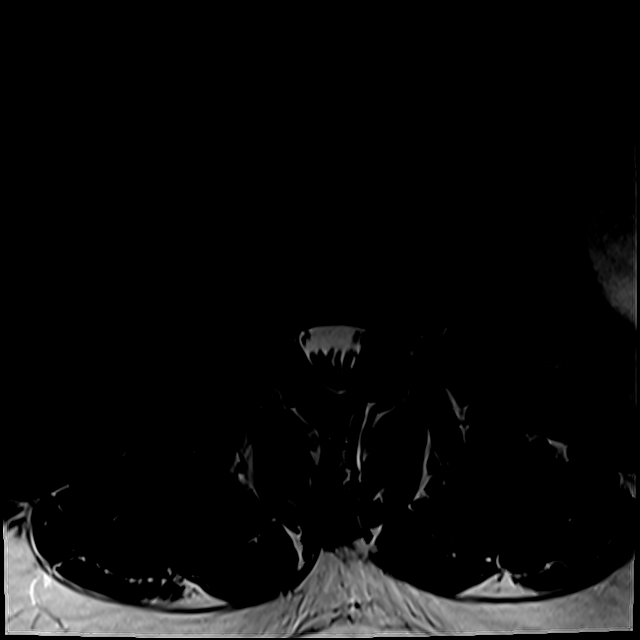
[im 22/43]
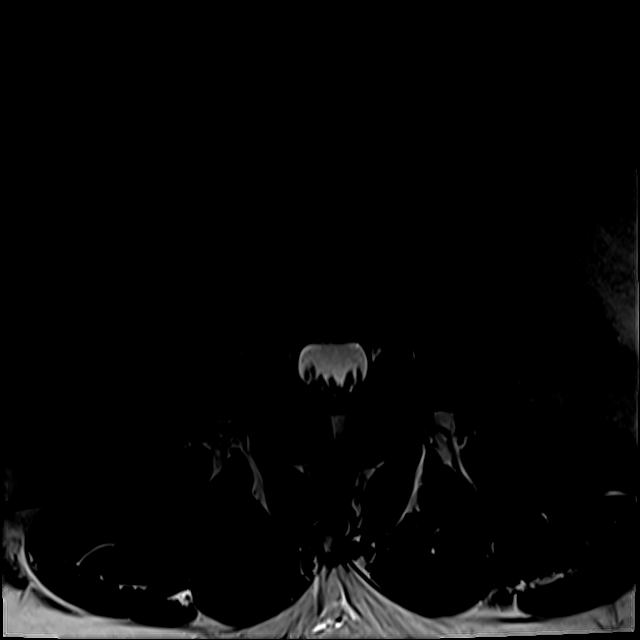
[im 37/43]
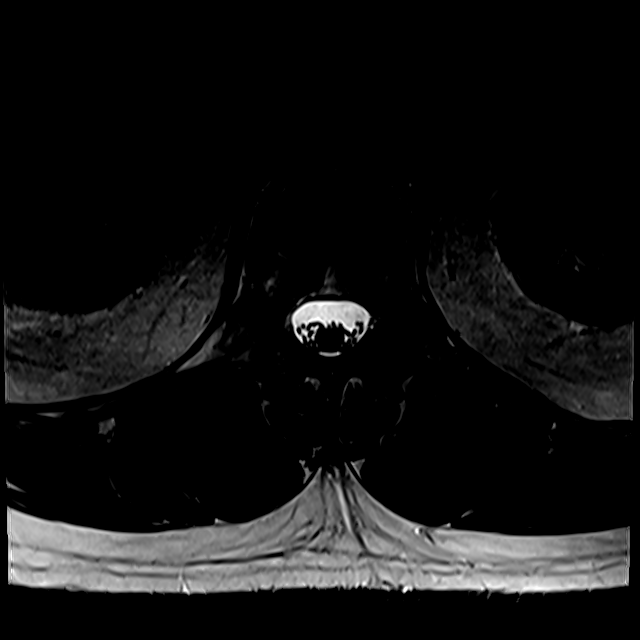

[18 of 48 positions shown; findings below may reference images not displayed]

FINDINGS: Segmentation: Transitional anatomy. For the purposes of this
dictation, there is partial lumbarization S1.

Alignment:  Grade 1 anterolisthesis at L5-S1.

Vertebrae: Vertebral body heights are maintained. There is no
substantial marrow edema. Vertebral body hemangiomas are present,
most notably at S1 and S2. No suspicious osseous lesion.

Conus medullaris and cauda equina: Conus extends to the L2 level.
Conus and cauda equina appear normal.

Paraspinal and other soft tissues: Partially imaged probable left
renal cysts.

Disc levels: Imaged in the sagittal plane only, there is a central
disc extrusion at T11-T12 extending below the disc level partially
effacing the ventral subarachnoid space.

L1-L2:  No canal or foraminal stenosis.

L2-L3:  No canal or foraminal stenosis.

L3-L4: Disc bulge slightly eccentric to the right. No canal or left
foraminal stenosis. Minor right foraminal stenosis.

L4-L5: Disc bulge with superimposed central disc extrusion extending
slightly above disc level. Marked facet arthropathy with ligamentum
flavum infolding. Marked canal stenosis. Narrowing of subarticular
recesses. Mild foraminal stenosis.

L5-S1: Anterolisthesis with uncovering of disc bulge and punctate
central annular fissure. Marked facet arthropathy with ligamentum
flavum infolding. Mild to moderate canal stenosis. Narrowing of the
left greater than right subarticular recesses. Mild right and mild
to moderate left foraminal stenosis.
IMPRESSION: Multilevel degenerative changes as detailed above. Canal and
subarticular recess stenosis greatest at L4-L5. There is also
subarticular recess narrowing at L5-S1. Facet arthropathy is
greatest at L4-L5 and L5-S1. Left foraminal narrowing greatest at
L5-S1.

## 2022-08-27 DIAGNOSIS — Z23 Encounter for immunization: Secondary | ICD-10-CM | POA: Diagnosis not present

## 2022-10-06 ENCOUNTER — Encounter: Payer: Self-pay | Admitting: Neurology

## 2022-10-06 DIAGNOSIS — Z125 Encounter for screening for malignant neoplasm of prostate: Secondary | ICD-10-CM | POA: Diagnosis not present

## 2022-10-06 DIAGNOSIS — E7800 Pure hypercholesterolemia, unspecified: Secondary | ICD-10-CM | POA: Diagnosis not present

## 2022-10-06 DIAGNOSIS — Z1331 Encounter for screening for depression: Secondary | ICD-10-CM | POA: Diagnosis not present

## 2022-10-06 DIAGNOSIS — Z91030 Bee allergy status: Secondary | ICD-10-CM | POA: Diagnosis not present

## 2022-10-06 DIAGNOSIS — M48061 Spinal stenosis, lumbar region without neurogenic claudication: Secondary | ICD-10-CM | POA: Diagnosis not present

## 2022-10-06 DIAGNOSIS — G252 Other specified forms of tremor: Secondary | ICD-10-CM | POA: Diagnosis not present

## 2022-10-06 DIAGNOSIS — Z23 Encounter for immunization: Secondary | ICD-10-CM | POA: Diagnosis not present

## 2022-10-06 DIAGNOSIS — Z0000 Encounter for general adult medical examination without abnormal findings: Secondary | ICD-10-CM | POA: Diagnosis not present

## 2022-10-06 DIAGNOSIS — J309 Allergic rhinitis, unspecified: Secondary | ICD-10-CM | POA: Diagnosis not present

## 2022-10-06 DIAGNOSIS — Z1211 Encounter for screening for malignant neoplasm of colon: Secondary | ICD-10-CM | POA: Diagnosis not present

## 2022-10-26 DIAGNOSIS — H833X3 Noise effects on inner ear, bilateral: Secondary | ICD-10-CM | POA: Diagnosis not present

## 2022-10-26 DIAGNOSIS — H903 Sensorineural hearing loss, bilateral: Secondary | ICD-10-CM | POA: Diagnosis not present

## 2022-10-26 DIAGNOSIS — H9313 Tinnitus, bilateral: Secondary | ICD-10-CM | POA: Diagnosis not present

## 2022-11-15 NOTE — Progress Notes (Unsigned)
Assessment/Plan:   Parkinsonism.  I suspect that this does represent idiopathic Parkinson's disease, but it is very, very mild.    -We decided to proceed with DaT scan  -we decided to do skin bx for alpha synuclein  -discussed value of safe, CV exercise, particularly biking  -further discussion following outcome of above   Subjective:   Brent White was seen today in the movement disorders clinic for neurologic consultation at the request of Antony Contras, MD.  The consultation is for the evaluation of rest tremor of the R hand, flattened affect and to r/o Parkinsons Disease.  Outside records that were made available to me were reviewed.  This patient is accompanied in the office by his spouse who supplements the history.   Specific Symptoms:  Tremor: Yes.  , started about 6 months ago in summer but didn't seem to notice it over the fall - R thumb.  He is R hand dominant.   Family hx of similar:  No. Voice: little weaker Sleep: trouble getting to sleep (20 min); once asleep, he will stay asleep  Vivid Dreams:  Yes.    Acting out dreams:  No. Wet Pillows: Yes.   Postural symptoms:  No.  Falls?  No. Bradykinesia symptoms: no bradykinesia noted Loss of smell:  No. Loss of taste:  No. Urinary Incontinence:  No. Difficulty Swallowing:  No. Handwriting, micrographia: Yes.   Trouble with ADL's:  No.  Trouble buttoning clothing: No. Depression:  No. Memory changes:  some trouble with focus/concentration Hallucinations:  No.  visual distortions: No. N/V:  No. Lightheaded:  No.  Syncope: No. Diplopia:  No. Dyskinesia:  No. Prior exposure to reglan/antipsychotics: No. Cramps: yes  Neuroimaging of the brain has not previously been performed.   PREVIOUS MEDICATIONS: none to date  ALLERGIES:  No Known Allergies  CURRENT MEDICATIONS:  Current Meds  Medication Sig   aspirin 81 MG tablet Take 81 mg by mouth daily.   loratadine (CLARITIN) 10 MG tablet Take 10 mg by  mouth daily.   Melatonin 5 MG/15ML LIQD Take by mouth.   ZINC-VITAMIN C PO Take by mouth.     Objective:   VITALS:   Vitals:   11/17/22 1329  BP: 110/76  Pulse: 63  SpO2: 99%  Weight: 217 lb 9.6 oz (98.7 kg)  Height: 5' 9.5" (1.765 m)    GEN:  The patient appears stated age and is in NAD. HEENT:  Normocephalic, atraumatic.  The mucous membranes are moist. The superficial temporal arteries are without ropiness or tenderness. CV:  RRR Lungs:  CTAB Neck/HEME:  There are no carotid bruits bilaterally.  Neurological examination:  Orientation: The patient is alert and oriented x3.  Cranial nerves: There is good facial symmetry. Extraocular muscles are intact. The visual fields are full to confrontational testing. The speech is fluent and clear. Soft palate rises symmetrically and there is no tongue deviation. Hearing is intact to conversational tone. Sensation: Sensation is intact to light and pinprick throughout (facial, trunk, extremities). Vibration is intact at the bilateral big toe but it is decreased distally. There is no extinction with double simultaneous stimulation. There is no sensory dermatomal level identified. Motor: Strength is 5/5 in the bilateral upper and lower extremities.   Shoulder shrug is equal and symmetric.  There is no pronator drift. Deep tendon reflexes: Deep tendon reflexes are 2+/4 at the bilateral biceps, triceps, brachioradialis, patella and achilles. Plantar responses are downgoing bilaterally.  Movement examination: Tone: There is mild  increased tone in the RUE Abnormal movements: none even with distraction (felt but not seen) Coordination:  There is mild decremation with RAM's, with any form of RAMS, including alternating supination and pronation of the forearm, hand opening and closing, finger taps on the R.  Heel and toe taps are good.   Gait and Station: The patient has no difficulty arising out of a deep-seated chair without the use of the hands.  The patient's stride length is good but he is a bit forward flexed.     Total time spent on today's visit was 60 minutes, including both face-to-face time and nonface-to-face time.  Time included that spent on review of records (prior notes available to me/labs/imaging if pertinent), discussing treatment and goals, answering patient's questions and coordinating care.  Cc:  Antony Contras, MD

## 2022-11-17 ENCOUNTER — Encounter: Payer: Self-pay | Admitting: Neurology

## 2022-11-17 ENCOUNTER — Ambulatory Visit: Payer: Medicare HMO | Admitting: Neurology

## 2022-11-17 VITALS — BP 110/76 | HR 63 | Ht 69.5 in | Wt 217.6 lb

## 2022-11-17 DIAGNOSIS — G20C Parkinsonism, unspecified: Secondary | ICD-10-CM | POA: Diagnosis not present

## 2022-11-17 DIAGNOSIS — M48061 Spinal stenosis, lumbar region without neurogenic claudication: Secondary | ICD-10-CM | POA: Insufficient documentation

## 2022-11-17 DIAGNOSIS — R251 Tremor, unspecified: Secondary | ICD-10-CM

## 2022-11-17 DIAGNOSIS — M4317 Spondylolisthesis, lumbosacral region: Secondary | ICD-10-CM | POA: Insufficient documentation

## 2022-11-17 NOTE — Patient Instructions (Signed)

## 2022-12-07 DIAGNOSIS — D3131 Benign neoplasm of right choroid: Secondary | ICD-10-CM | POA: Diagnosis not present

## 2022-12-07 DIAGNOSIS — Z0100 Encounter for examination of eyes and vision without abnormal findings: Secondary | ICD-10-CM | POA: Diagnosis not present

## 2022-12-18 ENCOUNTER — Ambulatory Visit: Payer: Medicare HMO | Admitting: Neurology

## 2022-12-18 ENCOUNTER — Encounter: Payer: Self-pay | Admitting: Neurology

## 2022-12-18 VITALS — BP 106/65 | HR 68

## 2022-12-18 DIAGNOSIS — G20C Parkinsonism, unspecified: Secondary | ICD-10-CM | POA: Diagnosis not present

## 2022-12-18 DIAGNOSIS — G20A1 Parkinson's disease without dyskinesia, without mention of fluctuations: Secondary | ICD-10-CM

## 2022-12-18 NOTE — Procedures (Signed)
Punch Biopsy Procedure Note  Preprocedure Diagnosis: Bradykinesia; Tremor;   Postprocedure Diagnosis: same  Locations: Site 1: left  trapezius ;  Site 2: above, left knee;  Site 3: above, left foot  Indications: r/o alpha synucleinopathy  Anesthesia: 3.5 mL Lidocaine 1% with epinephrine without added sodium bicarbonate  Procedure Details Patient informed of the risks (including but not limited to bleeding, pain, infection, scar and infection) and benefits of the procedure.  Informed consent obtained.  The areas which were chosen for biopsy, as above, and surrounding areas were given a sterile prep using betadyne and draped in the usual sterile fashion. The skin was then stretched perpendicular to the skin tension lines and sample removed using the 3 mm punch. Pressure applied, hemostasis achieved.   Dressing applied. The specimen(s) was sent for pathologic examination. The patient tolerated the procedure well.  Estimated Blood Loss: 0 ml  Condition: Stable  Complications: none.  Plan: 1. Instructed to keep the wound dry and covered for 24-48h and clean thereafter. 2. Warning signs of infection were reviewed.

## 2022-12-22 ENCOUNTER — Encounter (HOSPITAL_COMMUNITY)
Admission: RE | Admit: 2022-12-22 | Discharge: 2022-12-22 | Disposition: A | Discharge status: Home / Self Care | Payer: Medicare HMO | Source: Ambulatory Visit | Attending: Neurology | Admitting: Neurology

## 2022-12-22 DIAGNOSIS — R251 Tremor, unspecified: Secondary | ICD-10-CM | POA: Diagnosis not present

## 2022-12-22 MED ORDER — IOFLUPANE I 123 185 MBQ/2.5ML IV SOLN
4.9000 | Freq: Once | INTRAVENOUS | Status: AC | PRN
Start: 2022-12-22 — End: 2022-12-22
  Administered 2022-12-22: 4.9 via INTRAVENOUS

## 2022-12-22 MED ORDER — POTASSIUM IODIDE (ANTIDOTE) 130 MG PO TABS
ORAL_TABLET | ORAL | Status: AC
  Filled 2022-12-22: qty 1

## 2022-12-24 DIAGNOSIS — R251 Tremor, unspecified: Secondary | ICD-10-CM | POA: Diagnosis not present

## 2022-12-31 ENCOUNTER — Telehealth: Payer: Self-pay | Admitting: Neurology

## 2022-12-31 NOTE — Progress Notes (Signed)
Assessment/Plan:   1.  Parkinsons Disease  -Patient with very mild symptoms.  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  He is currently in [employer].  - Patient had skin biopsy done December 18, 2022.  There was evidence of alpha-synuclein in the posterior cervical and distal thigh biopsies.   - DaTscan done on February 20, 2023 demonstrated decreased radiotracer in the right stratum.  Interestingly, however, that most symptoms are on the right side, which is the opposite of the side of loss of dopamine  -We discussed that it used to be thought that levodopa would increase risk of melanoma but now it is believed that Parkinsons itself likely increases risk of melanoma. he is to get regular skin checks.  -After some discussion, we decided to start carbidopa/levodopa 25/100 and slowly work up to 1 tablet at 7 AM/11 AM/4 PM.  Discussed risk, benefits, side effects.  -Patient/wife asked many questions and I answered those to the best of my ability today.   Subjective:   Brent White was seen today in follow up for testing results.  Pt with wife who supplements hx.  Patient had skin biopsy done December 18, 2022.  There was evidence of alpha-synuclein in the posterior cervical and distal thigh biopsies.  DaTscan done on Dec 22, 2022 demonstrated decreased radiotracer in the right stratum.  I did personally review this scan.  He is exercising; doing recumbant and elliptical.  He also walks the neighborhood.  [place]      ALLERGIES:  No Known Allergies  CURRENT MEDICATIONS:  Current Meds  Medication Sig   aspirin 81 MG tablet Take 81 mg by mouth daily.   loratadine (CLARITIN) 10 MG tablet Take 10 mg by mouth daily.   Melatonin 5 MG/15ML LIQD Take by mouth.   ZINC-VITAMIN C PO Take by mouth.     Objective:   PHYSICAL EXAMINATION:    VITALS:    Vitals:   01/01/23 0802  BP: 118/76  Pulse: 66  SpO2: 98%  Weight: 222 lb 3.2 oz (100.8 kg)  Height: '5\' 9"'$  (1.753 m)    GEN:  The patient appears stated age and is in NAD. HEENT:  Normocephalic, atraumatic.  The mucous membranes are moist. The superficial temporal arteries are without ropiness or tenderness. CV:  RRR Lungs:  CTAB Neck/HEME:  There are no carotid bruits bilaterally.  Neurological examination:  Orientation: The patient is alert and oriented x3.  Cranial nerves: There is good facial symmetry. The speech is fluent and clear. Soft palate rises symmetrically and there is no tongue deviation. Hearing is intact to conversational tone. Sensation: Sensation is intact to light touch throughout. Motor: Strength is at least antigravity x 4.    Movement examination: Tone: There is mild increased tone in the RUE Abnormal movements: none even with distraction (felt but not seen) Coordination:  There is mild decremation with RAM's, with any form of RAMS, including alternating supination and pronation of the forearm, hand opening and closing, finger taps on the R.  Heel and toe taps are good.   Gait and Station: The patient has no difficulty arising out of a deep-seated chair without the use of the hands. The patient's stride length is good but he is a bit forward flexed.  this is same as last visit.  Total time spent on today's visit was 33 minutes, including both face-to-face time and nonface-to-face time.  Time included that spent on review of records (prior notes available to me/labs/imaging if pertinent), discussing treatment and goals, answering patient's questions and coordinating care.  Cc:  Antony Contras, MD

## 2022-12-31 NOTE — Telephone Encounter (Signed)
Pts skin biopsy came back.  There was:  evidence of alpha synuclein in the cutaneous nerves and posterior cervical and distal thigh biopsies  2.  No evidence of small fiber neuropathy 3.  No evidence of amyloid deposition within the cutaneous nerves

## 2023-01-01 ENCOUNTER — Ambulatory Visit: Payer: Medicare HMO | Admitting: Neurology

## 2023-01-01 ENCOUNTER — Encounter: Payer: Self-pay | Admitting: Neurology

## 2023-01-01 VITALS — BP 118/76 | HR 66 | Ht 69.0 in | Wt 222.2 lb

## 2023-01-01 DIAGNOSIS — G20A1 Parkinson's disease without dyskinesia, without mention of fluctuations: Secondary | ICD-10-CM | POA: Diagnosis not present

## 2023-01-01 MED ORDER — CARBIDOPA-LEVODOPA 25-100 MG PO TABS: 1.0000 | ORAL_TABLET | Freq: Three times a day (TID) | ORAL | 1 refills | Status: DC

## 2023-01-01 NOTE — Patient Instructions (Addendum)
Start Carbidopa Levodopa as follows: Take 1/2 tablet three times daily, at least 30 minutes before meals (approximately 7am/11am/4pm), for one week Then take 1/2 tablet in the morning, 1/2 tablet in the afternoon, 1 tablet in the evening, at least 30 minutes before meals, for one week Then take 1/2 tablet in the morning, 1 tablet in the afternoon, 1 tablet in the evening, at least 30 minutes before meals, for one week Then take 1 tablet three times daily at 7am/11am/4pm, at least 30 minutes before meals   As a reminder, carbidopa/levodopa can be taken at the same time as a carbohydrate, but we like to have you take your pill either 30 minutes before a protein source or 1 hour after as protein can interfere with carbidopa/levodopa absorption.  Local and Online Resources for Power over Parkinson's Group  January 2024   LOCAL [place] PARKINSON'S GROUPS   Power over Parkinson's Group:    Power Over Parkinson's Patient Education Group will be Wednesday, January 10th-*Hybrid meting*- in person at [facility] location and via [facility], 2:00-3:00 pm.   Starting in November, Power over [employer] and Care Partner Groups will meet together, with plans for separate break out session for caregivers (*this will be evolving over the next few months) Upcoming Power over Parkinson's Meetings/Care Partner Support:  2nd Wednesdays of the month at 2 pm:   January 10th, February 14th [place] at [email or web address] if interested in participating in this group    Brier! Moves [employer] Instructor-Led Classes offering at [employer]!  TUESDAYS and Wednesdays 1-2 pm.   Contact Vonna Kotyk at  [email or web address]  or [phone] (Tuesday classes are modified for chair and standing only) Drumming for Parkinson's will be held on 2nd and 4th Mondays at 11:00 am.   Located at the [place] ([place]  Contact Doylene Canning at [email or web address] or [place] Class, Mondays at 11 am.  Call [phone] for details Let's Try Pickleball-$25 for 6 weeks of Pickleball in Golf.  Contact Corwin Levins for more details and for dates.  [email or web address] SAVE THE DATE and REGISTER:  [place] Chapter of Parkinson's Foundation:  Parkinson's Symposium.  Conversations about Parkinson's.  Saturday, January 30, 2023, 9:00 am-2:00 pm.  [place], *In person or online via [place]*.  Register at [email or web address] or call Beverlee Nims at [phone].   [place]:  [email or web address]  PD Health at Home continues:  Mindfulness Mondays, Wellness Wednesdays, Fitness Fridays   Upcoming Education:    Environmental health practitioner your Voice:  Air cabin crew. Wednesday, January 3rd,  1-2 pm  Managing Weight Loss & Retaining Muscle Mass.  Wednesday, Jan 10th, 1-2 pm Changes in Speech and Voice.  Wednesday, January 17th, 1-2 pm Register for virtual education and Patent attorney (webinars) at [email or web address] Please check out their website to sign up for emails and see their full online offerings      Export:  www.[email or web address]   Third Thursday Webinars:  On the third Thursday of every month at 12 p.m. ET, join our free live webinars to learn about various aspects of living with Parkinson's disease and our work to speed medical breakthroughs.  Upcoming Webinar:  New Year, New Moves!  Explore Exercise for Life with Parkinson's.  Thursday, January 18th at 12 noon. Check out additional information on their website to see their  full online offerings    [employer]:  [email or web address]  Upcoming Webinar:   Physical Therapy and Parkinson's.  Thursday, January 11th, 2 pm  Webinar Series:   Living with Parkinson's Meetup.   Third Thursdays each month, 3 pm  Care Partner Monthly Meetup.  With Robin Searing Phinney.  First Tuesday of each month, 2 pm  Check out additional information to Live Well Today on their website    Parkinson and Movement Disorders (PMD) Alliance:  www.pmdalliance.org  NeuroLife Online:  Online Education Events  Sign up for emails, which are sent weekly to give you updates on programming and online offerings    Parkinson's Association of [place]:  www.parkinsonassociation.org  Information on online support groups, education events, and online exercises including Yoga, Parkinson's exercises and more-LOTS of information on links to PD resources and online events  Virtual Support Group through Parkinson's Association of the [place]; next one is scheduled for Wednesday, Feb 7th  MOVEMENT AND EXERCISE OPPORTUNITIES  PWR! Moves Classes at [place].  Wednesdays 10 and 11 am.   Contact Amy Marriott, PT [email or web address] if interested.  NEW PWR! Moves Class offerings at [employer].  *TUESDAYS* and Wednesdays 1-2 pm.    Contact Vonna Kotyk at  [email or web address]    Parkinson's Wellness Recovery (PWR! Moves)  www.pwr4life.org  Info on the PWR! Virtual Experience:  You will have access to our expertise?through self-assessment, guided plans that start with the PD-specific fundamentals, educational content, tips, Q&A with an expert, and a growing Art therapist of PD-specific pre-recorded and live exercise classes of varying types and intensity - both physical and cognitive! If that is not enough, we offer 1:1 wellness consultations (in-person or virtual) to personalize your PWR! Research scientist (medical).   [place] Fridays:   As part of the PD Health @ Home program, this free video series focuses each week on one aspect of fitness designed to support people living with Parkinson's.? These weekly videos highlight  the [place] fitness guidelines for people with Parkinson's disease.  ModemGamers.[email or web address]   Dance for PD website is offering free, live-stream classes throughout the week, as well as links to [employer] of classes:  [email or web address]://danceforparkinsons.org/  Virtual dance and Pilates for Parkinson's classes: Click on the Community Tab> Parkinson's Movement Initiative Tab.  To register for classes and for more information, visit [email or web address] and click the "community" tab.   [employer] Parkinson's Cycling Classes   [employer]:  Thursdays @ Noon-Live classes at [employer] ([employer] at [email or web address]?or [phone])  [facility] [employer]: Virtual Classes Mondays and Thursdays Jeanette Caprice classes Tuesday, Wednesday and Thursday (contact [place] at [email or web address] ?or [phone])  [place]  Varied levels of classes are offered Tuesdays and Thursdays at [employer].   Stretching with Verdis Frederickson weekly class is also offered for people with Parkinson's  To observe a class or for more information, call [phone] or email Hezzie Bump at [email or web address]   ADDITIONAL SUPPORT AND RESOURCES  Well-Spring Solutions:Online Caregiver Education Opportunities:  www.well-springsolutions.org/caregiver-education/caregiver-support-group.  You may also contact Vickki Muff at [email or web address] or [phone].     Well-Spring Navigator:  Just1Navigator program, a?free service to help individuals and families through the journey of determining care for older adults.  The "Navigator" is a [phone], Education officer, museum, who will speak with a prospective client and/or loved ones to provide an assessment of the situation and a set of recommendations for a personalized care plan -- all free of charge, and whether?Well-Spring Solutions offers the needed  service or not. If the need  is not a service we provide, we are well-connected with reputable programs in town that we can refer you to.  www.well-springsolutions.org or to speak with the Navigator, call [phone].

## 2023-04-14 DIAGNOSIS — D1801 Hemangioma of skin and subcutaneous tissue: Secondary | ICD-10-CM | POA: Diagnosis not present

## 2023-04-14 DIAGNOSIS — D2271 Melanocytic nevi of right lower limb, including hip: Secondary | ICD-10-CM | POA: Diagnosis not present

## 2023-04-14 DIAGNOSIS — L821 Other seborrheic keratosis: Secondary | ICD-10-CM | POA: Diagnosis not present

## 2023-04-14 DIAGNOSIS — D225 Melanocytic nevi of trunk: Secondary | ICD-10-CM | POA: Diagnosis not present

## 2023-04-14 DIAGNOSIS — L738 Other specified follicular disorders: Secondary | ICD-10-CM | POA: Diagnosis not present

## 2023-04-19 DIAGNOSIS — R69 Illness, unspecified: Secondary | ICD-10-CM | POA: Diagnosis not present

## 2023-04-27 DIAGNOSIS — R69 Illness, unspecified: Secondary | ICD-10-CM | POA: Diagnosis not present

## 2023-05-11 DIAGNOSIS — R69 Illness, unspecified: Secondary | ICD-10-CM | POA: Diagnosis not present

## 2023-05-18 DIAGNOSIS — R69 Illness, unspecified: Secondary | ICD-10-CM | POA: Diagnosis not present

## 2023-05-25 DIAGNOSIS — R69 Illness, unspecified: Secondary | ICD-10-CM | POA: Diagnosis not present

## 2023-05-26 DIAGNOSIS — R69 Illness, unspecified: Secondary | ICD-10-CM | POA: Diagnosis not present

## 2023-05-28 NOTE — Progress Notes (Unsigned)
    Assessment/Plan:   1.  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  He is currently in [employer].  - Patient had skin biopsy done December 18, 2022.  There was evidence of alpha-synuclein in the posterior cervical and distal thigh biopsies.   - DaTscan done on February 20, 2023 demonstrated decreased radiotracer in the right stratum.  Interestingly, however, that most symptoms are on the right side, which is the opposite of the side of loss of dopamine  -We discussed that it used to be thought that levodopa would increase risk of melanoma but now it is believed that Parkinsons itself likely increases risk of melanoma. he is to get regular skin checks.  -***Carbidopa/levodopa 25/100, 1 tablet 3 times per day.  -Patient/wife asked many questions and I answered those to the best of my ability today.   Subjective:   Brent White was seen today in follow up for testing results.  Pt with wife who supplements hx.  Patient started levodopa last visit.  He is tolerating that medication well, without side effects.  He continues to exercise.  [place]  Current movement disorder medications: Carbidopa/levodopa 25/100, 1 tablet 3 times per day (started last visit)  ALLERGIES:  No Known Allergies  CURRENT MEDICATIONS:  No outpatient medications have been marked as taking for the 06/01/23 encounter (Appointment) with Shacoya Burkhammer, Octaviano Batty, DO.     Objective:   PHYSICAL EXAMINATION:    VITALS:   There were no vitals filed for this visit.   GEN:  The patient appears stated age and is in NAD. HEENT:  Normocephalic, atraumatic.  The mucous membranes are moist. The superficial temporal arteries are without ropiness or tenderness. CV:  RRR Lungs:  CTAB Neck/HEME:  There are no carotid bruits bilaterally.  Neurological examination:  Orientation: The patient is alert and oriented x3.   Cranial nerves: There is good facial symmetry. The speech is fluent and clear. Soft palate rises symmetrically and there is no tongue deviation. Hearing is intact to conversational tone. Sensation: Sensation is intact to light touch throughout. Motor: Strength is at least antigravity x 4.    Movement examination: Tone: There is mild increased tone in the RUE Abnormal movements: none even with distraction (felt but not seen) Coordination:  There is mild decremation with RAM's, with any form of RAMS, including alternating supination and pronation of the forearm, hand opening and closing, finger taps on the R.  Heel and toe taps are good.   Gait and Station: The patient has no difficulty arising out of a deep-seated chair without the use of the hands. The patient's stride length is good but he is a bit forward flexed.  this is same as last visit.     Total time spent on today's visit was *** minutes, including both face-to-face time and nonface-to-face time.  Time included that spent on review of records (prior notes available to me/labs/imaging if pertinent), discussing treatment and goals, answering patient's questions and coordinating care.  Cc:  Tally Joe, MD

## 2023-06-01 ENCOUNTER — Encounter: Payer: Self-pay | Admitting: Neurology

## 2023-06-01 ENCOUNTER — Ambulatory Visit: Payer: Medicare HMO | Admitting: Neurology

## 2023-06-01 VITALS — BP 116/60 | HR 73 | Wt 227.2 lb

## 2023-06-01 DIAGNOSIS — G20A1 Parkinson's disease without dyskinesia, without mention of fluctuations: Secondary | ICD-10-CM

## 2023-06-01 DIAGNOSIS — R69 Illness, unspecified: Secondary | ICD-10-CM | POA: Diagnosis not present

## 2023-06-01 MED ORDER — CARBIDOPA-LEVODOPA 25-100 MG PO TABS: 1.0000 | ORAL_TABLET | Freq: Three times a day (TID) | ORAL | 2 refills | Status: DC

## 2023-06-01 NOTE — Patient Instructions (Signed)
Local and Online Resources for Power over Parkinson's Group  June 2024   LOCAL [place] PARKINSON'S GROUPS   Power over Parkinson's Group:    Power Over Parkinson's Patient Education Group will be Wednesday, JULY 10th-*PLEASE NOTE:  We will not be having a June meeting.  Our next meeting will be in July.  We apologize for the inconvenience.   Power over Parkinson's and Care Partner Groups will meet together, with plans for separate break out session for caregivers, depending on topic/speaker Upcoming Power over Parkinson's Meetings/Care Partner Support:  2nd Wednesdays of the month at 2 pm:   No regular June meeting, July 10th  Contact Amy Marriott at [email or web address] or Sarah Chambers at [email or web address] if interested in participating in this group    LOCAL EVENTS AND NEW OFFERINGS  PLEASE NOTE:  There will be no June Power over Parkinson's meeting (NO MEETING June 12th) Picnic Party for Parkinson's.  [place] [facility] Program Celebration.  June 19th 1-3 pm.  Contact Sarah Chambers at [email or web address] Parkinson's Social Game Night.  First Thursday of each month, 2:00-4:00 pm.  *Next date is June 6th*.  [facility], [place].  Contact [email or web address] if interested. Parkinson's CarePartner Group for Men is in the works, if interested email Sarah  [email or web address] ACT FITNESS Chair Yoga classes "Train and Gain", Fridays 10 am, [employer].  Contact Gina at [phone].  Community Fitness Instructor-Led Parkinson's Exercises Classes offering at [employer]!  TUESDAYS (Chair Yoga)  and Wednesdays (PWR! Moves)  1:00 pm.   Contact Christy Weaver at  [email or web address]  or [phone]  [place] PWR! Moves classes.  Thursdays at 11:45, [facility] [employer].  Free to participate through Parkinson Foundation grant.  Contact Sarah Chambers at [email or web address] or  [phone] to register Drumming for Parkinson's will be held on 2nd and 4th Mondays at 11:00 am.   Located at the [employer] ([address] [place].)  Contact Jane Maydian at [email or web address] or [phone]  [employer] Parkinson's Tai Chi Class, Mondays at 11 am.  Call [phone] for details   ONLINE EDUCATION AND SUPPORT  Parkinson Foundation:  www.parkinson.org  PD Health at Home continues:  Mindfulness Mondays, Wellness Wednesdays, Fitness Fridays  (PWR! Moves as part of Fitness Fridays March 22nd, 1-1:45 pm) Upcoming Education:   Current and Emerging Methods to Aid Parkinson's Diagnosis.  Wednesday, May 29th, 1-2 pm Recognize and Respond to Parkinson's Psychosis.  Wednesday, June 5th, 1-2 pm Parkinsonisms.  Wednesday, June 12th, 1-2 pm Expert Briefing:  Addressing the Challenge of Apathy in Parkinson's.  Wednesday, September 11th, 1-2 pm. Register for virtual education and expert briefings (webinars) at www.[email or web address].org/resources-support/online-education Please check out their website to sign up for emails and see their full online offerings     Michael J Fox Foundation:  www.michaeljfox.org   Third Thursday Webinars:  On the third Thursday of every month at 12 p.m. ET, join our free live webinars to learn about various aspects of living with Parkinson's disease and our work to speed medical breakthroughs.  Upcoming Webinar:  You Want to Volunteer for Parkinson's Research: Now What?  Thursday, June 20th at 12 noon. Check out additional information on their website to see their full online offerings    Davis Phinney Foundation:  www.davisphinneyfoundation.org  Upcoming Webinar:  Living Safely with Parkinson's Inside and Outside of your Home.  Thursday, June 13th , 3 pm Series:  Living with Parkinson's Meetup.   Third Thursdays each month, 3 pm    Care Partner Monthly Meetup.  With Connie Carpenter Phinney.  First Tuesday of each month, 2  pm  Check out additional information to Live Well Today on their website    Parkinson and Movement Disorders (PMD) Alliance:  www.pmdalliance.org  NeuroLife Online:  Online Education Events  Sign up for emails, which are sent weekly to give you updates on programming and online offerings    Parkinson's Association of [place]:  www.parkinsonassociation.org  Information on online support groups, education events, and online exercises including Yoga, Parkinson's exercises and more-LOTS of information on links to PD resources and online events  Virtual Support Group through Parkinson's Association of the [place]; next one is scheduled for Wednesday, June 5th   MOVEMENT AND EXERCISE OPPORTUNITIES  Parkinson's Exercise Class offerings at [employer]. *TUESDAYS* (Chair yoga) and Wednesdays (PWR! Moves)  1:00 pm.   *Please note that PWR! Moves [place] class has ended.  Please consider trying PWR! Moves on Wednesdays at 1 pm at [employer].  Contact Christy Weaver at [email or web address]    Parkinson's Wellness Recovery (PWR! Moves)  www.pwr4life.org  Info on the PWR! Virtual Experience:  You will have access to our expertise?through self-assessment, guided plans that start with the PD-specific fundamentals, educational content, tips, Q&A with an expert, and a growing library of PD-specific pre-recorded and live exercise classes of varying types and intensity - both physical and cognitive! If that is not enough, we offer 1:1 wellness consultations (in-person or virtual) to personalize your PWR! Virtual Experience.   Parkinson Foundation Fitness Fridays:   As part of the PD Health @ Home program, this free video series focuses each week on one aspect of fitness designed to support people living with Parkinson's.? These weekly videos highlight the Parkinson Foundation fitness guidelines for people with Parkinson's disease.   www.parkinson.org/resources-support/online-education/pdhealth#ff  Dance for PD website is offering free, live-stream classes throughout the week, as well as links to digital library of classes:  [email or web address]://danceforparkinsons.org/  Virtual dance and Pilates for Parkinson's classes: Click on the Community Tab> Parkinson's Movement Initiative Tab.  To register for classes and for more information, visit www.americandancefestival.org and click the "community" tab.   [employer] Parkinson's Cycling Classes   [employer]:  Thursdays @ Noon-Live classes at [employer] (Contact Margaret Hazen at [email or web address]?or [phone])  [facility]: Classes Tuesday, Wednesday and Thursday (contact Marlee at [email or web address] ?or [phone])  [place] [employer] Boxing  Varied levels of classes are offered Tuesdays and Thursdays at [employer].   Stretching with Maria weekly class is also offered for people with Parkinson's  To observe a class or for more information, call [phone] or email Hillary Savage at [email or web address]   ADDITIONAL SUPPORT AND RESOURCES  Well-Spring Solutions:  Online Caregiver Education Opportunities:  www.well-springsolutions.org/caregiver-education/caregiver-support-group.  You may also contact Jodi Kolada at [email or web address] or [phone].     Well-Spring Navigator:  Just1Navigator program, a?free service to help individuals and families through the journey of determining care for older adults.  The "Navigator" is a social worker, Nicole Reynolds, who will speak with a prospective client and/or loved ones to provide an assessment of the situation and a set of recommendations for a personalized care plan -- all free of charge, and whether?Well-Spring Solutions offers the needed service or not. If the need is not a service we provide, we are well-connected with reputable programs in town that we can refer you to.   www.well-springsolutions.org or to speak with the Navigator, call [phone].     

## 2023-06-15 DIAGNOSIS — R69 Illness, unspecified: Secondary | ICD-10-CM | POA: Diagnosis not present

## 2023-06-29 DIAGNOSIS — R69 Illness, unspecified: Secondary | ICD-10-CM | POA: Diagnosis not present

## 2023-06-30 ENCOUNTER — Other Ambulatory Visit: Payer: Self-pay | Admitting: Neurology

## 2023-07-15 DIAGNOSIS — R69 Illness, unspecified: Secondary | ICD-10-CM | POA: Diagnosis not present

## 2023-08-03 DIAGNOSIS — R69 Illness, unspecified: Secondary | ICD-10-CM | POA: Diagnosis not present

## 2023-08-12 DIAGNOSIS — R69 Illness, unspecified: Secondary | ICD-10-CM | POA: Diagnosis not present

## 2023-09-08 DIAGNOSIS — R69 Illness, unspecified: Secondary | ICD-10-CM | POA: Diagnosis not present

## 2023-09-15 DIAGNOSIS — Z23 Encounter for immunization: Secondary | ICD-10-CM | POA: Diagnosis not present

## 2023-09-20 DIAGNOSIS — R69 Illness, unspecified: Secondary | ICD-10-CM | POA: Diagnosis not present

## 2023-12-03 NOTE — Progress Notes (Signed)
 Assessment/Plan:   1.  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  Notes a little harder to play with dexterity/coordination but he is continuing to do so and its great therapy.  - Patient had skin biopsy done December 18, 2022.  There was evidence of alpha-synuclein in the posterior cervical and distal thigh biopsies.   - DaTscan  done on February 20, 2023 demonstrated decreased radiotracer in the right stratum.  Interestingly, however, that most symptoms are on the right side, which is the opposite of the side of loss of dopamine  -He will continue on carbidopa /levodopa  25/100, 1 tablet 3 times per day.  -he is having more cramping at bed but he doesn't want at bedtime levodopa .  He will think about that and discuss next visit.    -He is following faithfully with dermatology.  -he is exercising faithfully and proud of him but told him to increase the intensity some.    2.  REM behavior disorder  -This is commonly associated with PD and the patient is experiencing this.  We discussed that this can be very serious and even harmful.  We talked about medications as well as physical barriers to put in the bed (particularly soft bed rails, pillow barriers).  We talked about moving the night stand so that it is not so close to the side of the bed.  He is not ready for medication   Subjective:   Brent White was seen today in follow up for follow-up Parkinson's disease.  Patient is with his wife who supplements the history.  He is doing well with levodopa .  He has had no falls.  No lightheadedness or near syncope.  He has some vivid dreams (3-4 total since last visit).  No falls out of bed with those. He can flail with those.    He is still playing trumpet but has a little more trouble with finger dexterity/coordination.  He is having frequent leg cramps at bed/night.  He is  exercising on his bike at 60-65 rpm.    Current movement disorder medications: Carbidopa /levodopa  25/100, 1 tablet 3 times per day   ALLERGIES:  No Known Allergies  CURRENT MEDICATIONS:  Current Meds  Medication Sig   aspirin 81 MG tablet Take 81 mg by mouth daily.   carbidopa -levodopa  (SINEMET  IR) 25-100 MG tablet TAKE 1 TABLET BY MOUTH THREE TIMES DAILY( 7 AM, 11 AM AND 4 PM)   loratadine (CLARITIN) 10 MG tablet Take 10 mg by mouth daily.   Melatonin 5 MG/15ML LIQD Take by mouth.   ZINC-VITAMIN C PO Take by mouth.     Objective:   PHYSICAL EXAMINATION:    VITALS:   Vitals:   12/07/23 0832  BP: 112/76  Pulse: 63  SpO2: 98%  Weight: 228 lb 12.8 oz (103.8 kg)    GEN:  The patient appears stated age and is in NAD. HEENT:  Normocephalic, atraumatic.  The mucous membranes are moist. The superficial temporal arteries are without ropiness or tenderness. CV:  RRR Lungs:  CTAB Neck/HEME:  There are no carotid bruits bilaterally.  Neurological examination:  Orientation: The patient is alert and oriented x3.  Cranial nerves: There is good facial symmetry. The speech is fluent and clear. Soft palate rises symmetrically and there is no tongue deviation. Hearing is intact to conversational tone. Sensation: Sensation is intact to  light touch throughout. Motor: Strength is at least antigravity x 4.   Movement examination: Tone: There is nl tone in the UE/LE Abnormal movements: none Coordination:  There is decremation only with finger taps on the right (and very minimally so).  All other RAMs are normal with any form of RAMS, including alternating supination and pronation of the forearm, hand opening and closing, heel taps and toe taps bilaterally.  Gait and Station: The patient has no difficulty arising out of a deep-seated chair without the use of the hands. The patient's stride length is good with good arm swing  Total time spent on today's visit was 30 minutes, including both  face-to-face time and nonface-to-face time.  Time included that spent on review of records (prior notes available to me/labs/imaging if pertinent), discussing treatment and goals, answering patient's questions and coordinating care.    Cc:  Seabron Lenis, MD

## 2023-12-07 ENCOUNTER — Encounter: Payer: Self-pay | Admitting: Neurology

## 2023-12-07 ENCOUNTER — Ambulatory Visit: Payer: Medicare HMO | Admitting: Neurology

## 2023-12-07 VITALS — BP 112/76 | HR 63 | Wt 228.8 lb

## 2023-12-07 DIAGNOSIS — G20A1 Parkinson's disease without dyskinesia, without mention of fluctuations: Secondary | ICD-10-CM | POA: Diagnosis not present

## 2023-12-07 DIAGNOSIS — G4752 REM sleep behavior disorder: Secondary | ICD-10-CM

## 2023-12-07 NOTE — Patient Instructions (Signed)
 Local and Online Resources for Power over Parkinson's Group?  January 2025 ?  LOCAL [place] PARKINSON'S GROUPS??  Power over Parkinson's Group:???  Upcoming Power over [employer] Meetings/Care Partner Support:? 2nd Wednesdays of the month at 2 pm:  January 8th, February 12th Contact Lynwood Dawley at [email or web address] or Amy Marriott at [email or web address] if interested in participating in this group?  ?  LOCAL EVENTS AND NEW OFFERINGS?  Dance Project Spring 2025:  January 14-May 20, Tuesdays 10-11 am.  All details on website: [email or web address] ACT FITNESS Chair Yoga classes "Train and Gain", Fridays 10 am, [employer].  Contact Gina at [phone].   PWR! Moves Paint class!  Wednesdays at 10 am.  Please contact Lonia Blood, PT at [email or web address] if interested. Health visitor Classes offering at [employer]!? Tuesdays (Chair Yoga)  and Wednesdays (PWR! Moves)  1:00 pm.?? Contact Aldona Lento [phone] or [email or web address] Drumming for Parkinson's will be held on 2nd and 4th Mondays at 11:00 am.?? Located at the [place] of the [place] ([address] [place]. [place].) *Next class is January 13th.? Contact Albertina Parr at [email or web address] or [phone]?  [facility] Parkinson's Tai Chi Class, Mondays at 11 am.  Call [phone] for details  [facility] at [facility]- [address], [place] Wednesdays- 4:00 - 5:00 PM - specifically for Parkinson's Disease.  Free!  Contact Denny Peon, [place] - [phone] (clinic) or  [phone] (cell) or by email: [email or web address]   ?ONLINE EDUCATION AND SUPPORT?  Parkinson Foundation:? www.parkinson.org?  PD Health at Home continues:? Mindfulness Mondays, Wellness Wednesdays, Fitness Fridays??  Upcoming Education:??  Empowerment through Movement, Wednesday,  January 15th, 1-2 pm A Deep Dive into Deep Brain Stimulation (DBS), Wednesday, January 29th, 1-2 pm Expert Briefing:    Stay tuned Register for virtual education and expert briefings (webinars) at [employer].gl  Please check out their website to sign up for emails and see their full online offerings??  ?  [employer] Foundation:? www.michaeljfox.org??  Third Thursday Webinars:? On the third Thursday of every month at 12 p.m. ET, join our free live webinars to learn about various aspects of living with Parkinson's disease and our work to speed medical breakthroughs.?  Upcoming Webinar:? Managing the Hidden Symptoms:  Mood and Motivation Changes in Parkinson's.  Thursday, January 16th at 12 noon.  Check out additional information on their website to see their full online offerings?  ?  [employer]:? www.[email or web address].org?  Upcoming Webinar:   Stay tuned Series:? Living with Parkinson's Meetup.?? Third Thursdays each month, 3 pm?  Care Partner Monthly Meetup.? With Jillene Bucks [place].? First Tuesday of each month, 2 pm?  Check out additional information to Live Well Today on their website?  ?  Parkinson and Movement Disorders (PMD) Alliance:? www.pmdalliance.org?  NeuroLife Online:? Online Education Events?  Sign up for emails, which are sent weekly to give you updates on programming and online offerings?  ?  Parkinson's Association of [place]:? www.parkinsonassociation.org?  Information on online support groups, education events, and online exercises including Yoga, Parkinson's exercises and more-LOTS of information on links to PD resources and online events?  Virtual Support Group through [employer] of the [place]-First Wednesday of each month at 2 pm   MOVEMENT AND EXERCISE OPPORTUNITIES?  PWR! Moves [place] class has returned!  Wednesdays at 10 am.  Please contact Lonia Blood, PT at  [email or web address] if interested. Parkinson's Exercise Class offerings at [employer]. Tuesdays (Chair yoga) and Wednesdays (PWR! Moves)  1:00  pm.?  Contact Aldona Lento [phone] or [email or web address]  Parkinson's Wellness Recovery (PWR! Moves)? www.pwr4life.org?  Info on the PWR! Virtual Experience:? You will have access to our expertise?through self-assessment, guided plans that start with the PD-specific fundamentals, educational content, tips, Q&A with an expert, and a growing Engineering geologist of PD-specific pre-recorded and live exercise classes of varying types and intensity - both physical and cognitive! If that is not enough, we offer 1:1 wellness consultations (in-person or virtual) to personalize your PWR! Dance movement psychotherapist.??  Parkinson [employer] Fridays:??  As part of the PD Health @ Home program, this free video series focuses each week on one aspect of fitness designed to support people living with Parkinson's.? These weekly videos highlight the Parkinson Foundation fitness guidelines for people with Parkinson's disease.?  MenusLocal.com.br?  Dance for PD website is offering free, live-stream classes throughout the week, as well as links to [employer] of classes:? https://danceforparkinsons.org/?  Virtual dance and Pilates for Parkinson's classes: Click on the Community Tab> Parkinson's Movement Initiative Tab.? To register for classes and for more information, visit www.[email or web address].co.za and click the "community" tab.??  [employer] Parkinson's Cycling Classes??  [facility]:? Thursdays @ Noon-Live classes at [employer] ([employer] at [email or web address]?or [phone])?  [facility]: Classes Tuesday, Wednesday and Thursday (contact [place] at [email or web address] ?or [phone])?  [employer]?  Varied levels of classes are offered Tuesdays and  Thursdays at [facility]??  Stretching with Byrd Hesselbach weekly class is also offered for people with Parkinson's?  To observe a class or for more information, call [phone] or email Patricia Nettle at info@[email or web address] [email or web address]?    ADDITIONAL SUPPORT AND RESOURCES?  Well[employer]Spring [employer]:  Chiropractor:? www.well-springsolutions.org/caregiver-education/caregiver-support-group.? You may also contact Loleta Chance at [facility] -spring.org or [phone].????  Well-Spring Navigator:? Just1Navigator program, a?free service to help individuals and families through the journey of determining care for older adults.? The "Navigator" is a Child psychotherapist, Sidney Ace, who will speak with a prospective client and/or loved ones to provide an assessment of the situation and a set of recommendations for a personalized care plan -- all free of charge, and whether?Well[employer]Spring [employer] offers the needed service or not. If the need is not a service we provide, we are well-connected with reputable programs in town that we can refer you to.? www.well-springsolutions.org or to speak with the Navigator, call [phone].?

## 2024-03-13 ENCOUNTER — Encounter: Payer: Self-pay | Admitting: Neurology

## 2024-03-24 ENCOUNTER — Encounter: Payer: Self-pay | Admitting: Neurology

## 2024-03-27 ENCOUNTER — Other Ambulatory Visit: Payer: Self-pay

## 2024-03-27 DIAGNOSIS — R251 Tremor, unspecified: Secondary | ICD-10-CM

## 2024-03-27 DIAGNOSIS — G20A1 Parkinson's disease without dyskinesia, without mention of fluctuations: Secondary | ICD-10-CM

## 2024-03-27 MED ORDER — CARBIDOPA-LEVODOPA 25-100 MG PO TABS: 1.0000 | ORAL_TABLET | Freq: Three times a day (TID) | ORAL | 0 refills | Status: DC

## 2024-03-28 DIAGNOSIS — H40013 Open angle with borderline findings, low risk, bilateral: Secondary | ICD-10-CM | POA: Diagnosis not present

## 2024-04-09 NOTE — Progress Notes (Unsigned)
 Virtual Visit Via Video       Consent was obtained for video visit:  {yes no:314532} Answered questions that patient had about telehealth interaction:  {yes no:314532} I discussed the limitations, risks, security and privacy concerns of performing an evaluation and management service by telemedicine. I also discussed with the patient that there may be a patient responsible charge related to this service. The patient expressed understanding and agreed to proceed.  Pt location: Home Physician Location: office Name of referring provider:  Rae Bugler, MD I connected with Brent White at patients initiation/request on 04/10/2024 at  3:00 PM EDT by video enabled telemedicine application and verified that I am speaking with the correct person using two identifiers. Pt MRN:  [id number] Pt DOB:  November 13, 1956 Video Participants:  Brent White;  ***  Assessment/Plan:   1.  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  He is currently in [place] big [employer].  Notes a little harder to play with dexterity/coordination but he is continuing to do so and its great therapy.  - Patient had skin biopsy done December 18, 2022.  There was evidence of alpha-synuclein in the posterior cervical and distal thigh biopsies.   - DaTscan  done on February 20, 2023 demonstrated decreased radiotracer in the right stratum.  Interestingly, however, that most symptoms are on the right side, which is the opposite of the side of loss of dopamine  -He will continue on carbidopa /levodopa  25/100, 1 tablet 3 times per day.  -add carbidopa /levodopa  50/200 at bedtime for nocturnal leg cramping  -He is following faithfully with dermatology.  2.  REM behavior disorder  -This is commonly associated with PD and the patient is experiencing this.  We discussed that this can be very serious and even harmful.  We talked about medications as well as  physical barriers to put in the bed (particularly soft bed rails, pillow barriers).  We talked about moving the night stand so that it is not so close to the side of the bed.  He is not ready for medication   Subjective:   Brent White was seen today in follow up for follow-up Parkinson's disease.  Patient is with his wife who supplements the history.  He requested work in to discuss q hs leg cramping.  It was discussed last visit but it wasn't so bad then and we/he decided to hold off on adding further medication.  Sx's have progressed since that time.    Current movement disorder medications: Carbidopa /levodopa  25/100, 1 tablet 3 times per day   ALLERGIES:  No Known Allergies  CURRENT MEDICATIONS:  No outpatient medications have been marked as taking for the 04/10/24 encounter (Appointment) with Brendaliz Kuk, [place], DO.     Objective:   PHYSICAL EXAMINATION:    VITALS:   There were no vitals filed for this visit.   GEN:  The patient appears stated age and is in NAD. HEENT:  Normocephalic, atraumatic.    Neurological examination:  Orientation: The patient is alert and oriented x3.  Cranial nerves: There is good facial symmetry. The speech is fluent and clear.  Hearing is intact to conversational tone. Motor: Strength is at least antigravity x 4.   Movement examination: Abnormal movements: none Coordination:  There is decremation only with finger taps on the right (and very minimally so).  All other RAMs are normal with any form of RAMS, including alternating  supination and pronation of the forearm, hand opening and closing, heel taps and toe taps bilaterally.  Gait and Station: The patient has no difficulty arising out of a deep-seated chair without the use of the hands. The patient's stride length is good with good arm swing  Follow up Instructions      -I discussed the assessment and treatment plan with the patient. The patient was provided an opportunity to ask questions  and all were answered. The patient agreed with the plan and demonstrated an understanding of the instructions.   The patient was advised to call back or seek an in-person evaluation if the symptoms worsen or if the condition fails to improve as anticipated.    Total time spent on today's visit was ***minutes, including both face-to-face time and nonface-to-face time.  Time included that spent on review of records (prior notes available to me/labs/imaging if pertinent), discussing treatment and goals, answering patient's questions and coordinating care.   Fran Imus, DO    Cc:  Rae Bugler, MD

## 2024-04-10 ENCOUNTER — Encounter: Payer: Self-pay | Admitting: Neurology

## 2024-04-10 ENCOUNTER — Telehealth (INDEPENDENT_AMBULATORY_CARE_PROVIDER_SITE_OTHER): Admitting: Neurology

## 2024-04-10 DIAGNOSIS — G20A1 Parkinson's disease without dyskinesia, without mention of fluctuations: Secondary | ICD-10-CM | POA: Diagnosis not present

## 2024-04-10 MED ORDER — CARBIDOPA-LEVODOPA 25-100 MG PO TABS: 1.0000 | ORAL_TABLET | Freq: Three times a day (TID) | ORAL | 0 refills | Status: DC

## 2024-04-10 MED ORDER — CARBIDOPA-LEVODOPA ER 50-200 MG PO TBCR: 1.0000 | EXTENDED_RELEASE_TABLET | Freq: Every day | ORAL | 1 refills | Status: DC

## 2024-06-05 ENCOUNTER — Ambulatory Visit: Payer: Medicare HMO | Admitting: Neurology

## 2024-06-07 NOTE — Progress Notes (Signed)
 Assessment/Plan:   1.  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  Notes a little harder to play with dexterity/coordination but he is continuing to do so and its great therapy.             - Patient had skin biopsy done December 18, 2022.  There was evidence of alpha-synuclein in the posterior cervical and distal thigh biopsies.              - DaTscan  done on February 20, 2023 demonstrated decreased radiotracer in the right stratum.  Interestingly, however, that most symptoms are on the right side, which is the opposite of the side of loss of dopamine             -He will continue on carbidopa /levodopa  25/100, 1 tablet 3 times per day.             - Continue carbidopa /levodopa  50/200 at bedtime for nocturnal leg cramping.  Unfortunately, it didn't seem to help much with the cramping but it helped with the at bedtime twitching in the bed, which helped wifes quality of life.  -discussed DA at bed for cramps but decided to hold on it.  He feels he is doing well with the addition of salt sticks and magnesium.  -increase water intake.  Discussed that it definitely can help with the cramping.             -He is following faithfully with dermatology.   2.  REM behavior disorder             -This is commonly associated with PD and the patient is experiencing this.  We discussed that this can be very serious and even harmful.  We talked about medications as well as physical barriers to put in the bed (particularly soft bed rails, pillow barriers).  We talked about moving the night stand so that it is not so close to the side of the bed.  He is not ready for medication   Subjective:   Brent White was seen today in follow up for follow-up Parkinson's disease.  Patient is with his wife who supplements the history.  We added nighttime CR levodopa  last visit for leg  cramping and he reports that the medication did not help.  He is taking salt and Mg.  He has had no falls.  No lightheadedness or near syncope.  He is exercising faithfully and biking at 70 rpm.  He is wanting to learn to play pickleball.  He plays trumpet and he notes not as good as clean as used to be.  Current movement disorder medications: Carbidopa /levodopa  25/100, 1 tablet 3 times per day  Carbidopa /levodopa  50/200 at bedtime (added last visit for leg cramping)  ALLERGIES:  No Known Allergies  CURRENT MEDICATIONS:  Current Meds  Medication Sig   aspirin 81 MG tablet Take 81 mg by mouth daily.   carbidopa -levodopa  (SINEMET  CR) 50-200 MG tablet Take 1 tablet by mouth at bedtime.   loratadine (CLARITIN) 10 MG tablet Take 10 mg by mouth daily.   Melatonin 5 MG/15ML LIQD Take by mouth.   [DISCONTINUED] carbidopa -levodopa  (SINEMET  IR) 25-100 MG tablet Take 1 tablet by mouth 3 (three) times daily.     Objective:   PHYSICAL EXAMINATION:    VITALS:  Vitals:   06/09/24 0905  BP: 124/74  Pulse: 70  SpO2: 95%  Weight: 227 lb 9.6 oz (103.2 kg)     GEN:  The patient appears stated age and is in NAD. HEENT:  Normocephalic, atraumatic.  The mucous membranes are moist. The superficial temporal arteries are without ropiness or tenderness. CV:  RRR Lungs:  CTAB Neck/HEME:  There are no carotid bruits bilaterally.  Neurological examination:  Orientation: The patient is alert and oriented x3.  Cranial nerves: There is good facial symmetry. The speech is fluent and clear. Soft palate rises symmetrically and there is no tongue deviation. Hearing is intact to conversational tone. Sensation: Sensation is intact to light touch throughout. Motor: Strength is at least antigravity x 4.   Movement examination: Tone: There is nl tone in the UE/LE Abnormal movements: none Coordination:  There is decremation only with finger taps on the left. There is overflow to the mouth All other RAMs are  normal.   Gait and Station: The patient has no difficulty arising out of a deep-seated chair without the use of the hands. The patient's stride length is good with mild decreased arm swing on the left.  Neg pull test.  Total time spent on today's visit was 28 minutes, including both face-to-face time and nonface-to-face time.  Time included that spent on review of records (prior notes available to me/labs/imaging if pertinent), discussing treatment and goals, answering patient's questions and coordinating care.    Cc:  Seabron Lenis, MD

## 2024-06-09 ENCOUNTER — Ambulatory Visit: Admitting: Neurology

## 2024-06-09 ENCOUNTER — Encounter: Payer: Self-pay | Admitting: Neurology

## 2024-06-09 VITALS — BP 124/74 | HR 70 | Wt 227.6 lb

## 2024-06-09 DIAGNOSIS — G4752 REM sleep behavior disorder: Secondary | ICD-10-CM | POA: Diagnosis not present

## 2024-06-09 DIAGNOSIS — G20A1 Parkinson's disease without dyskinesia, without mention of fluctuations: Secondary | ICD-10-CM

## 2024-06-09 MED ORDER — CARBIDOPA-LEVODOPA 25-100 MG PO TABS: 1.0000 | ORAL_TABLET | Freq: Three times a day (TID) | ORAL | 2 refills | Status: DC

## 2024-06-09 NOTE — Patient Instructions (Signed)
 Increase water intake.    SAVE THE DATE!  We are planning a Parkinsons Disease educational symposium at [facility], [address] [place], [place], [place] [address] on September 19.  We will have a movement disorder physician expert from [employer] coming to speak and a caregiver speaker.  We will have a panel of experts that will show you who you may need on your team of people on your journey with Parkinsons.  I hope to see you there!  Use this QR code to register by scanning it with the camera app on your phone:      Need more help with registration?  Contact [email or web address]

## 2024-09-05 ENCOUNTER — Other Ambulatory Visit: Payer: Self-pay | Admitting: Neurology

## 2024-09-05 DIAGNOSIS — G20A1 Parkinson's disease without dyskinesia, without mention of fluctuations: Secondary | ICD-10-CM

## 2024-09-08 ENCOUNTER — Telehealth: Payer: Self-pay | Admitting: Neurology

## 2024-09-08 ENCOUNTER — Other Ambulatory Visit: Payer: Self-pay

## 2024-09-08 DIAGNOSIS — G20A1 Parkinson's disease without dyskinesia, without mention of fluctuations: Secondary | ICD-10-CM

## 2024-09-08 MED ORDER — CARBIDOPA-LEVODOPA 25-100 MG PO TABS: 1.0000 | ORAL_TABLET | Freq: Three times a day (TID) | ORAL | 0 refills | Status: DC

## 2024-09-08 NOTE — Telephone Encounter (Signed)
 Pt called in and  he needs a refill on the prescription called   carbidopa -levodopa  (SINEMET  IR) 25-100 MG tablet  Pt would like a 90 day supply. Thanks.

## 2024-12-05 ENCOUNTER — Other Ambulatory Visit: Payer: Self-pay | Admitting: Neurology

## 2024-12-05 ENCOUNTER — Ambulatory Visit: Attending: Cardiology | Admitting: Cardiology

## 2024-12-05 VITALS — BP 112/66 | HR 60 | Ht 70.0 in | Wt 236.0 lb

## 2024-12-05 DIAGNOSIS — G20A1 Parkinson's disease without dyskinesia, without mention of fluctuations: Secondary | ICD-10-CM

## 2024-12-05 DIAGNOSIS — I493 Ventricular premature depolarization: Secondary | ICD-10-CM | POA: Diagnosis not present

## 2024-12-05 DIAGNOSIS — G20B2 Parkinson's disease with dyskinesia, with fluctuations: Secondary | ICD-10-CM | POA: Diagnosis not present

## 2024-12-05 DIAGNOSIS — I2510 Atherosclerotic heart disease of native coronary artery without angina pectoris: Secondary | ICD-10-CM

## 2024-12-05 NOTE — Progress Notes (Unsigned)
 " Cardiology Office Note:  .   Date:  12/05/2024  ID:  Brent White, DOB [birth year], MRN [id number] PCP: Seabron Lenis, MD  [facility] Providers Cardiologist:  Oneil Parchment, MD     History of Present Illness: .   Brent White is a 69 y.o. male Discussed the use of AI scribe   History of Present Illness Brent White is a 69 year old male with coronary artery disease and Parkinson's disease who presents for follow-up of palpitations and cardiovascular health.  He experiences palpitations described as a 'flipping or a bubble' sensation at the top of his chest. These episodes are short-lived and occur sporadically. He has a history of premature ventricular contractions (PVCs) and has previously undergone monitoring with a Holter monitor. His last coronary calcium score on July 30, 2021, was 3, placing him in the 24th percentile for his age.  He has a history of Parkinson's disease, diagnosed two years ago, and takes Sinemet  daily. He experiences pain in his knees and hips, which he wonders might be exacerbated by Parkinson's. He engages in daily cycling for about 30 minutes.  He is retired but stays active by volunteering at his church. He has recently gained eight pounds over the holidays and is working on losing weight through increased physical activity, including walking and cycling. He mentions a past weight loss of 35 pounds three years ago, which he attributes to increased physical activity during a ski trip.  Family history is notable for his father having had a pacemaker and defibrillator.     Studies Reviewed: SABRA   EKG Interpretation Date/Time:  Tuesday December 05 2024 13:36:30 EST Ventricular Rate:  61 PR Interval:  176 QRS Duration:  94 QT Interval:  396 QTC Calculation: 398 R Axis:   -28  Text Interpretation: Normal sinus rhythm Normal ECG No previous ECGs available Confirmed by Parchment Oneil ([id number]) on 12/05/2024 1:49:53 PM     Results Radiology Coronary calcium score (07/30/2021): Score 3, 24th percentile for age  Diagnostic Echocardiogram (08/07/2020): Left ventricular ejection fraction 65%, grade 1 diastolic dysfunction, mildly enlarged right ventricle, normal pulmonary artery pressures, mild to moderate aortic valve sclerosis EKG (12/05/2024): Normal, no premature ventricular contractions or arrhythmias observed (Independently interpreted) Risk Assessment/Calculations:            Physical Exam:   VS:  BP 112/66 (BP Location: Left Arm, Patient Position: Sitting, Cuff Size: Normal)   Pulse 60   Ht 5' 10 (1.778 m)   Wt 236 lb (107 kg)   SpO2 97%   BMI 33.86 kg/m    Wt Readings from Last 3 Encounters:  12/05/24 236 lb (107 kg)  06/09/24 227 lb 9.6 oz (103.2 kg)  12/07/23 228 lb 12.8 oz (103.8 kg)    GEN: Well nourished, well developed in no acute distress NECK: No JVD; No carotid bruits CARDIAC: RRR, no murmurs, no rubs, no gallops RESPIRATORY:  Clear to auscultation without rales, wheezing or rhonchi  ABDOMEN: Soft, non-tender, non-distended EXTREMITIES:  No edema; No deformity   ASSESSMENT AND PLAN: .    Assessment and Plan Assessment & Plan Premature ventricular contractions Intermittent palpitations described as a flipping or bubbling sensation at the top of the chest. EKG today is normal with no PVCs detected. Symptoms are likely related to PVCs, possibly exacerbated by dehydration or caffeine intake. No immediate concern as symptoms are short-lived and not associated with significant arrhythmias. - Monitor symptoms and report if he becomes more frequent or  bothersome. - Consider using a Xeo monitor for two weeks if symptoms persist or worsen. - Reduce caffeine intake, including Pepsi, to manage symptoms. - Maintain hydration.         Dispo:   Signed, Oneil Parchment, MD  "

## 2024-12-05 NOTE — Patient Instructions (Signed)
 Medication Instructions:  The current medical regimen is effective;  continue present plan and medications.  *If you need a refill on your cardiac medications before your next appointment, please call your pharmacy*  Follow-Up: At [facility], you and your health needs are our priority.  As part of our continuing mission to provide you with exceptional heart care, our providers are all part of one team.  This team includes your primary Cardiologist (physician) and Advanced Practice Providers or APPs (Physician Assistants and Nurse Practitioners) who all work together to provide you with the care you need, when you need it.  Your next appointment:   2 year(s)  Provider:   Donato Schultz, MD     We recommend signing up for the patient portal called "MyChart".  Sign up information is provided on this After Visit Summary.  MyChart is used to connect with patients for Virtual Visits (Telemedicine).  Patients are able to view lab/test results, encounter notes, upcoming appointments, etc.  Non-urgent messages can be sent to your provider as well.   To learn more about what you can do with MyChart, go to [email or web address].

## 2024-12-14 NOTE — Progress Notes (Signed)
 "   Assessment/Plan:   1.  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  He is currently in [employer].  Notes a little harder to play with dexterity/coordination but he is continuing to do so and its great therapy.             - Patient had skin biopsy done December 18, 2022.  There was evidence of alpha-synuclein in the posterior cervical and distal thigh biopsies.              - DaTscan  done on February 20, 2023 demonstrated decreased radiotracer in the right stratum.  Interestingly, however, that most symptoms are on the right side, which is the opposite of the side of loss of dopamine             -He will continue on carbidopa /levodopa  25/100, 1 tablet 3 times per day.             - Continue carbidopa /levodopa  50/200 at bedtime for nocturnal leg cramping.  Unfortunately, it didn't seem to help much with the cramping but it helped with the at bedtime twitching in the bed, which helped wifes quality of life.  -discussed DA at bed for cramps but decided to hold on it.  He feels he is doing well with the addition of salt sticks and magnesium.  -discussed again increase water intake.  Discussed that it definitely can help with the cramping.             -He is following faithfully with dermatology.  Has appt next week.  Knows that Parkinsons Disease increases risk for melanoma   2.  REM behavior disorder             -This is commonly associated with PD and the patient is experiencing this.  We discussed that this can be very serious and even harmful.  We talked about medications as well as physical barriers to put in the bed (particularly soft bed rails, pillow barriers).  We talked about moving the night stand so that it is not so close to the side of the bed.  He is not ready for medication  3.  Joint pain  -likely more related to arthritis in hips/knees   Subjective:   Brent White was  seen today in follow up for follow-up Parkinson's disease.  Patient is with his wife who supplements the history.  Overall, patient has been doing fairly well.  He has had no falls.  Exercising on the bike and walking for exercise.  No visual hallucinations.  Has vivid dreams.  Still lacks good water intake.  No lightheadedness or near syncope.  In regards to cramps, he reports that he is just doing the salt sticks and its helps. He remains on his levodopa .  He stopped the CR levodopa  as he wasn't sure it was helpful  He saw Dr. Jeffrie January 6.  Notes reviewed.  Current movement disorder medications: Carbidopa /levodopa  25/100, 1 tablet 3 times per day  Carbidopa /levodopa  50/200 at bedtime   Prior meds:  carbidopa /levodopa  50/200 at bed (didn't help cramps but helped at bedtime twitching but he stopped it)  ALLERGIES:   Allergies  Allergen Reactions   Bee Venom Hives    CURRENT MEDICATIONS:  No outpatient medications have been marked as taking for the 12/18/24 encounter (Office Visit) with Diedra Sinor,  Asberry RAMAN, DO.     Objective:   PHYSICAL EXAMINATION:    VITALS:   Vitals:   12/18/24 0816  BP: 116/70  Pulse: 70  SpO2: 99%  Weight: 236 lb 6.4 oz (107.2 kg)    GEN:  The patient appears stated age and is in NAD. HEENT:  Normocephalic, atraumatic.  The mucous membranes are moist. The superficial temporal arteries are without ropiness or tenderness. CV:  RRR Lungs:  CTAB Neck/HEME:  There are no carotid bruits bilaterally.  Neurological examination:  Orientation: The patient is alert and oriented x3.  Cranial nerves: There is good facial symmetry. The speech is fluent and clear. Soft palate rises symmetrically and there is no tongue deviation. Hearing is intact to conversational tone. Sensation: Sensation is intact to light touch throughout. Motor: Strength is at least antigravity x 4.   Movement examination: Tone: There is nl tone in the UE/LE Abnormal movements:  none Coordination:  There is decremation only with finger taps on the left (stable). There is overflow to the mouth All other RAMs are normal.   Gait and Station: The patient has no difficulty arising out of a deep-seated chair without the use of the hands. The patient's stride length is good with mild decreased arm swing on the left (stable).  Neg pull test.  Total time spent on today's visit was 30 minutes, including both face-to-face time and nonface-to-face time.  Time included that spent on review of records (prior notes available to me/labs/imaging if pertinent), discussing treatment and goals, answering patient's questions and coordinating care.    Cc:  Seabron Lenis, MD "

## 2024-12-18 ENCOUNTER — Ambulatory Visit (INDEPENDENT_AMBULATORY_CARE_PROVIDER_SITE_OTHER): Admitting: Neurology

## 2024-12-18 ENCOUNTER — Encounter: Payer: Self-pay | Admitting: Neurology

## 2024-12-18 VITALS — BP 116/70 | HR 70 | Wt 236.4 lb

## 2024-12-18 DIAGNOSIS — M2550 Pain in unspecified joint: Secondary | ICD-10-CM | POA: Diagnosis not present

## 2024-12-18 DIAGNOSIS — G4752 REM sleep behavior disorder: Secondary | ICD-10-CM

## 2024-12-18 DIAGNOSIS — G20A1 Parkinson's disease without dyskinesia, without mention of fluctuations: Secondary | ICD-10-CM

## 2025-06-18 ENCOUNTER — Ambulatory Visit: Payer: Self-pay | Admitting: Neurology
